# Patient Record
Sex: Male | Born: 1995 | Race: White | Hispanic: No | Marital: Married | Smoking: Never smoker
Health system: Southern US, Community
[De-identification: ages and names within clinical notes are randomized; demographics above are authoritative.]

## PROBLEM LIST (undated history)

## (undated) DIAGNOSIS — B019 Varicella without complication: Secondary | ICD-10-CM

## (undated) HISTORY — DX: Varicella without complication: B01.9

---
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
## 2015-01-29 ENCOUNTER — Ambulatory Visit (INDEPENDENT_AMBULATORY_CARE_PROVIDER_SITE_OTHER): Payer: Managed Care, Other (non HMO) | Admitting: Family Medicine

## 2015-01-29 ENCOUNTER — Encounter: Payer: Self-pay | Admitting: Family Medicine

## 2015-01-29 VITALS — BP 117/78 | HR 55 | Temp 98.5°F | Ht 68.0 in | Wt 142.0 lb

## 2015-01-29 DIAGNOSIS — Z0000 Encounter for general adult medical examination without abnormal findings: Secondary | ICD-10-CM

## 2015-01-29 NOTE — Progress Notes (Signed)
   Subjective:    Patient ID: Jonathan Sanders, male    DOB: 1995/08/22, 19 y.o.   MRN: [id number]  HPI 19 yr old male for a cpx. He feels well in general but he does ask me to check his right upper mouth. He had a tooth pulled yesterday and last night the right side of his face swelled up. He applied ice and today most of the swelling has resolved. He has mild pain in the area but not much.    Review of Systems  Constitutional: Negative.   HENT: Positive for facial swelling. Negative for congestion, dental problem, drooling, ear discharge, ear pain, hearing loss, mouth sores, nosebleeds, postnasal drip, rhinorrhea, sinus pressure, sneezing, sore throat, tinnitus, trouble swallowing and voice change.   Eyes: Negative.   Respiratory: Negative.   Cardiovascular: Negative.   Gastrointestinal: Negative.   Genitourinary: Negative.   Musculoskeletal: Negative.   Skin: Negative.   Neurological: Negative.   Psychiatric/Behavioral: Negative.        Objective:   Physical Exam  Constitutional: He is oriented to person, place, and time. He appears well-developed and well-nourished. No distress.  HENT:  Head: Normocephalic and atraumatic.  Right Ear: External ear normal.  Left Ear: External ear normal.  Nose: Nose normal.  Mouth/Throat: Oropharynx is clear and moist. No oropharyngeal exudate.  Mildly swollen over the right cheek but the area is not tender  Eyes: Conjunctivae and EOM are normal. Pupils are equal, round, and reactive to light. Right eye exhibits no discharge. Left eye exhibits no discharge. No scleral icterus.  Neck: Neck supple. No JVD present. No tracheal deviation present. No thyromegaly present.  Cardiovascular: Normal rate, regular rhythm, normal heart sounds and intact distal pulses.  Exam reveals no gallop and no friction rub.   No murmur heard. Pulmonary/Chest: Effort normal and breath sounds normal. No respiratory distress. He has no wheezes. He has no rales. He exhibits  no tenderness.  Abdominal: Soft. Bowel sounds are normal. He exhibits no distension and no mass. There is no tenderness. There is no rebound and no guarding.  Genitourinary: Rectum normal, prostate normal and penis normal. Guaiac negative stool. No penile tenderness.  Musculoskeletal: Normal range of motion. He exhibits no edema or tenderness.  Lymphadenopathy:    He has no cervical adenopathy.  Neurological: He is alert and oriented to person, place, and time. He has normal reflexes. No cranial nerve deficit. He exhibits normal muscle tone. Coordination normal.  Skin: Skin is warm and dry. No rash noted. He is not diaphoretic. No erythema. No pallor.  Psychiatric: He has a normal mood and affect. His behavior is normal. Judgment and thought content normal.          Assessment & Plan:  Well exam. We discussed diet and exercise advice. He will set up fasting labs soon. The site of his dental extraction looks clean.

## 2015-01-29 NOTE — Progress Notes (Signed)
Pre visit review using our clinic review tool, if applicable. No additional management support is needed unless otherwise documented below in the visit note. 

## 2015-02-01 ENCOUNTER — Other Ambulatory Visit (INDEPENDENT_AMBULATORY_CARE_PROVIDER_SITE_OTHER): Payer: Managed Care, Other (non HMO)

## 2015-02-01 DIAGNOSIS — Z0000 Encounter for general adult medical examination without abnormal findings: Secondary | ICD-10-CM

## 2015-02-01 LAB — CBC WITH DIFFERENTIAL/PLATELET
BASOS PCT: 0.4 % (ref 0.0–3.0)
Basophils Absolute: 0 10*3/uL (ref 0.0–0.1)
EOS ABS: 0.6 10*3/uL (ref 0.0–0.7)
Eosinophils Relative: 6.5 % — ABNORMAL HIGH (ref 0.0–5.0)
HEMATOCRIT: 45.7 % (ref 36.0–49.0)
HEMOGLOBIN: 15.7 g/dL (ref 12.0–16.0)
LYMPHS PCT: 24 % (ref 24.0–48.0)
Lymphs Abs: 2.4 10*3/uL (ref 0.7–4.0)
MCHC: 34.3 g/dL (ref 31.0–37.0)
MCV: 86.6 fl (ref 78.0–98.0)
Monocytes Absolute: 0.9 10*3/uL (ref 0.1–1.0)
Monocytes Relative: 9 % (ref 3.0–12.0)
Neutro Abs: 6 10*3/uL (ref 1.4–7.7)
Neutrophils Relative %: 60.1 % (ref 43.0–71.0)
Platelets: 180 10*3/uL (ref 150.0–575.0)
RBC: 5.28 Mil/uL (ref 3.80–5.70)
RDW: 13.2 % (ref 11.4–15.5)
WBC: 9.9 10*3/uL (ref 4.5–13.5)

## 2015-02-01 LAB — HEPATIC FUNCTION PANEL
ALK PHOS: 98 U/L (ref 52–171)
ALT: 12 U/L (ref 0–53)
AST: 19 U/L (ref 0–37)
Albumin: 4.9 g/dL (ref 3.5–5.2)
Bilirubin, Direct: 0.1 mg/dL (ref 0.0–0.3)
Total Bilirubin: 0.6 mg/dL (ref 0.2–1.2)
Total Protein: 7.6 g/dL (ref 6.0–8.3)

## 2015-02-01 LAB — POCT URINALYSIS DIPSTICK
BILIRUBIN UA: NEGATIVE
Glucose, UA: NEGATIVE
Ketones, UA: NEGATIVE
Leukocytes, UA: NEGATIVE
Nitrite, UA: NEGATIVE
Protein, UA: NEGATIVE
RBC UA: NEGATIVE
Spec Grav, UA: 1.02
UROBILINOGEN UA: 0.2
pH, UA: 7

## 2015-02-01 LAB — LIPID PANEL
Cholesterol: 151 mg/dL (ref 0–200)
HDL: 58 mg/dL (ref >39.00)
LDL Cholesterol: 82 mg/dL (ref 0–99)
NONHDL: 93.23
Total CHOL/HDL Ratio: 3
Triglycerides: 57 mg/dL (ref 0.0–149.0)
VLDL: 11.4 mg/dL (ref 0.0–40.0)

## 2015-02-01 LAB — BASIC METABOLIC PANEL
BUN: 9 mg/dL (ref 6–23)
CO2: 33 mEq/L — ABNORMAL HIGH (ref 19–32)
Calcium: 10.3 mg/dL (ref 8.4–10.5)
Chloride: 101 mEq/L (ref 96–112)
Creatinine, Ser: 0.81 mg/dL (ref 0.40–1.50)
GFR: 130 mL/min (ref >60.00)
Glucose, Bld: 83 mg/dL (ref 70–99)
Potassium: 4.6 mEq/L (ref 3.5–5.1)
Sodium: 141 mEq/L (ref 135–145)

## 2015-02-01 LAB — TSH: TSH: 3.23 u[IU]/mL (ref 0.40–5.00)

## 2015-02-16 ENCOUNTER — Telehealth: Payer: Self-pay | Admitting: Family Medicine

## 2015-02-16 NOTE — Telephone Encounter (Signed)
Pt wife is calling concerning a form she dropped off a month ago and pt had cpx 01-29-15. Pt wife would like to know the status of form

## 2015-02-17 NOTE — Telephone Encounter (Signed)
Paperwork is ready for pick up and I left a voice message for pt.

## 2015-06-16 ENCOUNTER — Ambulatory Visit (INDEPENDENT_AMBULATORY_CARE_PROVIDER_SITE_OTHER): Payer: Managed Care, Other (non HMO) | Admitting: Family Medicine

## 2015-06-16 ENCOUNTER — Encounter: Payer: Self-pay | Admitting: Family Medicine

## 2015-06-16 VITALS — BP 114/73 | HR 70 | Temp 98.6°F | Ht 68.0 in | Wt 143.0 lb

## 2015-06-16 DIAGNOSIS — Z0289 Encounter for other administrative examinations: Secondary | ICD-10-CM | POA: Insufficient documentation

## 2015-06-16 DIAGNOSIS — Z023 Encounter for examination for recruitment to armed forces: Secondary | ICD-10-CM

## 2015-06-16 NOTE — Progress Notes (Signed)
Pre visit review using our clinic review tool, if applicable. No additional management support is needed unless otherwise documented below in the visit note. 

## 2015-06-16 NOTE — Progress Notes (Signed)
   Subjective:    Patient ID: Jonathan Sanders, male    DOB: 10-20-1995, 20 y.o.   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 He feels fine.   Review of Systems  Constitutional: Negative.   Respiratory: Negative.   Cardiovascular: Negative.   Neurological: Negative.        Objective:   Physical Exam  Constitutional: He is oriented to person, place, and time. He appears well-developed and well-nourished.  Neck: No thyromegaly present.  Cardiovascular: Normal rate, regular rhythm, normal heart sounds and intact distal pulses.   Pulmonary/Chest: Effort normal and breath sounds normal.  Lymphadenopathy:    He has no cervical adenopathy.  Neurological: He is alert and oriented to person, place, and time.          Assessment & Plan:  He is healthy. He is cleared for the POPAT exam.

## 2015-09-07 ENCOUNTER — Ambulatory Visit (INDEPENDENT_AMBULATORY_CARE_PROVIDER_SITE_OTHER): Payer: Managed Care, Other (non HMO) | Admitting: Family Medicine

## 2015-09-07 ENCOUNTER — Encounter: Payer: Self-pay | Admitting: Family Medicine

## 2015-09-07 VITALS — BP 126/81 | HR 54 | Temp 98.4°F | Ht 68.0 in | Wt 144.0 lb

## 2015-09-07 DIAGNOSIS — K648 Other hemorrhoids: Secondary | ICD-10-CM | POA: Insufficient documentation

## 2015-09-07 DIAGNOSIS — K649 Unspecified hemorrhoids: Secondary | ICD-10-CM | POA: Insufficient documentation

## 2015-09-07 MED ORDER — HYDROCORTISONE ACE-PRAMOXINE 2.5-1 % RE CREA
1.0000 | TOPICAL_CREAM | Freq: Three times a day (TID) | RECTAL | Status: DC
Start: 2015-09-07 — End: 2016-07-10

## 2015-09-07 NOTE — Progress Notes (Signed)
Pre visit review using our clinic review tool, if applicable. No additional management support is needed unless otherwise documented below in the visit note. 

## 2015-09-07 NOTE — Progress Notes (Signed)
Subjective:    Patient ID: NIKOLOS CORKILL, male    DOB: 1995-08-10, 20 y.o.   MRN: [id number]  HPI Here for hemorrhoids that itch and burn at times. No bleeding. He has tried Preparation H and witch hazel OTC with mixed results.    Review of Systems  Constitutional: Negative.   Gastrointestinal: Positive for rectal pain. Negative for blood in stool and anal bleeding.       Objective:   Physical Exam  Constitutional: He appears well-developed and well-nourished.  Abdominal: Soft. Bowel sounds are normal. He exhibits no distension and no mass. There is no tenderness. There is no rebound and no guarding.  Genitourinary:  Several small external hemorrhoids are seen           Assessment & Plan:  Hemorrhoids, try Pramoxine HC cream prn.  Nelwyn Salisbury, MD

## 2016-07-10 ENCOUNTER — Encounter: Payer: Self-pay | Admitting: Family Medicine

## 2016-07-10 ENCOUNTER — Ambulatory Visit (INDEPENDENT_AMBULATORY_CARE_PROVIDER_SITE_OTHER): Payer: BLUE CROSS/BLUE SHIELD | Admitting: Family Medicine

## 2016-07-10 VITALS — BP 108/80 | Ht 68.0 in | Wt 145.0 lb

## 2016-07-10 DIAGNOSIS — G4710 Hypersomnia, unspecified: Secondary | ICD-10-CM | POA: Diagnosis not present

## 2016-07-10 NOTE — Patient Instructions (Signed)
WE NOW OFFER   [facility] FAST TRACK!!!  SAME DAY Appointments for ACUTE CARE  Such as: Sprains, Injuries, cuts, abrasions, rashes, muscle pain, joint pain, back pain Colds, flu, sore throats, headache, allergies, cough, fever  Ear pain, sinus and eye infections Abdominal pain, nausea, vomiting, diarrhea, upset stomach Animal/insect bites  3 Easy Ways to Schedule: Walk-In Scheduling Call in scheduling Mychart Sign-up: [email or web address]         

## 2016-07-10 NOTE — Progress Notes (Signed)
   Subjective:    Patient ID: Jonathan Sanders, male    DOB: 04/21/1996, 21 y.o.   MRN: [id number]  HPI Here for a worsening pattern of sleeping too deeply. He says he has been a deep sleeper all his life and that he always has a hard time waking up. However over the past few months this has gotten to the point that he sleeps through multiple alarms and it is affecting his job. He says he sleeps well and averages 8-10 hours a day, but he often feels tired an sleepy during the day. He snores off and on, but his wife has never noted him to stop breathing. His weight is stable.    Review of Systems  Constitutional: Positive for fatigue. Negative for activity change, appetite change and unexpected weight change.  Respiratory: Negative.   Cardiovascular: Negative.   Neurological: Negative.   Psychiatric/Behavioral: Negative.        Objective:   Physical Exam  Constitutional: He is oriented to person, place, and time. He appears well-developed and well-nourished.  Neck: No thyromegaly present.  Cardiovascular: Normal rate, regular rhythm, normal heart sounds and intact distal pulses.   Pulmonary/Chest: Effort normal and breath sounds normal.  Lymphadenopathy:    He has no cervical adenopathy.  Neurological: He is alert and oriented to person, place, and time.  Psychiatric: He has a normal mood and affect. His behavior is normal. Thought content normal.          Assessment & Plan:  Excessive sleepiness. We will refer him to Sleep Medicine.  Gershon CraneStephen Fry, MD

## 2016-07-10 NOTE — Progress Notes (Signed)
Pre visit review using our clinic review tool, if applicable. No additional management support is needed unless otherwise documented below in the visit note. 

## 2016-07-12 ENCOUNTER — Ambulatory Visit (INDEPENDENT_AMBULATORY_CARE_PROVIDER_SITE_OTHER): Payer: BLUE CROSS/BLUE SHIELD | Admitting: Pulmonary Disease

## 2016-07-12 ENCOUNTER — Encounter: Payer: Self-pay | Admitting: Pulmonary Disease

## 2016-07-12 DIAGNOSIS — G4710 Hypersomnia, unspecified: Secondary | ICD-10-CM | POA: Diagnosis not present

## 2016-07-12 DIAGNOSIS — G4711 Idiopathic hypersomnia with long sleep time: Secondary | ICD-10-CM | POA: Insufficient documentation

## 2016-07-12 NOTE — Assessment & Plan Note (Signed)
Differential diagnosis here is idiopathic hypersomnolence versus narcolepsy. Does not seem to have any red flags to indicate obstructive sleep apnea or restless leg syndrome.  We discussed rules of sleep hygiene, regular sleep and wake up at times, exercise program and plan for at least 1 or 2 naps in the daytime, caution while driving  We will schedule overnight polysomnogram followed by [facility] I have asked him to keep a sleep diary for at least 2 weeks

## 2016-07-12 NOTE — Patient Instructions (Signed)
schedule overnight polysomnogram followed by [facility] I have asked him to keep a sleep diary for at least 2 weeks

## 2016-07-12 NOTE — Progress Notes (Signed)
Subjective:    Patient ID: Jonathan Sanders, male    DOB: 01/15/1996, 21 y.o.   MRN: [id number]  HPI  Chief Complaint  Patient presents with  . Sleep Consult    Referred by Dr. Clent Ridges. Easy for him to fall asleep but hard to wake up. Has been going on for years.     Jonathan Sanders is a 21 year old man presents for evaluation of excessive daytime somnolence. Epworth sleepiness score is 13 and a report sleepiness while sitting and reading, as a passenger in a car or, lying down to rest in the afternoon. He has fallen asleep while having conversations with his wife. 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 Even as a teenager he would always sleep very hard and was hard to wake up. He was home schooled and it would always take them 3 or 4 alarms to wake up. He has been married for 3 years and his wife always has to call him when he is away from home to ensure that he has woken up in the morning. Bedtime is between 9 and 10 PM, sleep latency is minimal, he sleeps on his right side with one pillow, denies nocturnal awakenings and is out of bed anywhere between 5 AM to 8 AM depending on his work schedule.  The longer he sleeps to worse he feels, takes about an hour or 2 to get going. He reports one to 2 episodes of nocturnal enuresis over the last 2 months. His older brother had a similar problem with bedwetting as a teenager. His wife has not witnessed apneas and has not reported loud snoring. He drinks 3-6 cans of Pepsi daily and 1 or 2 monster energy drinks when he is on the road. There is no history suggestive of cataplexy or parasomnias  He does report history suggestive of sleep paralysis especially on waking up from sleep His grandfather may have had narcolepsy but is not clear at what age he was diagnosed. He denies childhood history of  trauma except for one episode of concussion as a teenager     Past Medical History:  Diagnosis Date  . Chicken pox      No past surgical history on file.   No Known Allergies   Social History   Social History  . Marital status: Single    Spouse name: N/A  . Number of children: N/A  . Years of education: N/A   Occupational History  . Not on file.   Social History Main Topics  . Smoking status: Never Smoker  . Smokeless tobacco: Never Used  . Alcohol use 0.0 oz/week     Comment: occ  . Drug use: No  . Sexual activity: Not on file   Other Topics Concern  . Not on file   Social History Narrative  . No narrative on file   Breast Cancer-relatedfamily history is not on file.   Review of Systems Constitutional: negative for anorexia, fevers and sweats  Eyes: negative for irritation, redness and visual disturbance  Ears, nose, mouth, throat, and face: negative for earaches, epistaxis, nasal congestion and sore throat  Respiratory: negative for cough, dyspnea on exertion, sputum and wheezing  Cardiovascular: negative for chest pain, dyspnea, lower extremity edema, orthopnea, palpitations and syncope  Gastrointestinal: negative for abdominal pain, constipation, diarrhea, melena, nausea and  vomiting  Genitourinary:negative for dysuria, frequency and hematuria  Hematologic/lymphatic: negative for bleeding, easy bruising and lymphadenopathy  Musculoskeletal:negative for arthralgias, muscle weakness and stiff joints  Neurological: negative for coordination problems, gait problems, headaches and weakness  Endocrine: negative for diabetic symptoms including polydipsia, polyuria and weight loss     Objective:   Physical Exam   Gen. Pleasant, well-nourished, in no distress, normal affect ENT - no lesions, no post nasal drip Neck: No JVD, no thyromegaly, no carotid bruits Lungs: no use of accessory muscles, no dullness to percussion, clear without rales or rhonchi    Cardiovascular: Rhythm regular, heart sounds  normal, no murmurs or gallops, no peripheral edema Abdomen: soft and non-tender, no hepatosplenomegaly, BS normal. Musculoskeletal: No deformities, no cyanosis or clubbing Neuro:  alert, non focal        Assessment & Plan:

## 2016-08-29 ENCOUNTER — Ambulatory Visit (HOSPITAL_BASED_OUTPATIENT_CLINIC_OR_DEPARTMENT_OTHER): Payer: BLUE CROSS/BLUE SHIELD | Attending: Pulmonary Disease | Admitting: Pulmonary Disease

## 2016-08-29 DIAGNOSIS — G4710 Hypersomnia, unspecified: Secondary | ICD-10-CM | POA: Diagnosis not present

## 2016-08-30 ENCOUNTER — Ambulatory Visit (HOSPITAL_BASED_OUTPATIENT_CLINIC_OR_DEPARTMENT_OTHER): Payer: BLUE CROSS/BLUE SHIELD | Attending: Pulmonary Disease | Admitting: Pulmonary Disease

## 2016-08-30 DIAGNOSIS — G4719 Other hypersomnia: Secondary | ICD-10-CM | POA: Diagnosis not present

## 2016-08-30 DIAGNOSIS — G4711 Idiopathic hypersomnia with long sleep time: Secondary | ICD-10-CM | POA: Insufficient documentation

## 2016-08-30 DIAGNOSIS — G4710 Hypersomnia, unspecified: Secondary | ICD-10-CM

## 2016-08-31 DIAGNOSIS — G4710 Hypersomnia, unspecified: Secondary | ICD-10-CM | POA: Diagnosis not present

## 2016-08-31 NOTE — Procedures (Signed)
Patient Name: Jonathan Sanders, [name] Study Date: 08/29/2016 Gender: Male D.O.B: Jun 15, 1995 Age (years): 20 Referring Provider: Cyril Mourningakesh Raynold Blankenbaker MD, ABSM Height (inches): 68 Interpreting Physician: Cyril Mourningakesh Lachanda Buczek MD, ABSM Weight (lbs): 140 RPSGT: Ulyess MortSpruill, Vicki BMI: 21 MRN: [id number] Neck Size: 14.00   CLINICAL INFORMATION Sleep Study Type: NPSG  Indication for sleep study: Daytime Fatigue, Fatigue  Epworth Sleepiness Score: 12  SLEEP STUDY TECHNIQUE As per the AASM Manual for the Scoring of Sleep and Associated Events v2.3 (April 2016) with a hypopnea requiring 4% desaturations.  The channels recorded and monitored were frontal, central and occipital EEG, electrooculogram (EOG), submentalis EMG (chin), nasal and oral airflow, thoracic and abdominal wall motion, anterior tibialis EMG, snore microphone, electrocardiogram, and pulse oximetry.  MEDICATIONS Medications self-administered by patient taken the night of the study : N/A  SLEEP ARCHITECTURE The study was initiated at 10:38:49 PM and ended at 6:04:25 AM.  Sleep onset time was 7.4 minutes and the sleep efficiency was 93.9%. The total sleep time was 418.5 minutes.  Stage REM latency was 186.0 minutes.  The patient spent 14.81% of the night in stage N1 sleep, 53.88% in stage N2 sleep, 8.72% in stage N3 and 22.58% in REM.  Alpha intrusion was absent.  Supine sleep was 3.93%.  RESPIRATORY PARAMETERS The overall apnea/hypopnea index (AHI) was 0.4 per hour. There were 2 total apneas, including 1 obstructive, 1 central and 0 mixed apneas. There were 1 hypopneas and 30 RERAs.  The AHI during Stage REM sleep was 0.6 per hour.  AHI while supine was 0.0 per hour.  The mean oxygen saturation was 96.46%. The minimum SpO2 during sleep was 93.00%.  Soft snoring was noted during this study.  CARDIAC DATA The 2 lead EKG demonstrated sinus rhythm. The mean heart rate was 61.29 beats per minute. Other EKG findings include: None.   LEG  MOVEMENT DATA The total PLMS were 36 with a resulting PLMS index of 5.16. Associated arousal with leg movement index was 0.6 .  IMPRESSIONS - No significant obstructive sleep apnea occurred during this study (AHI = 0.4/h). - No significant central sleep apnea occurred during this study (CAI = 0.1/h). - The patient had minimal or no oxygen desaturation during the study (Min O2 = 93.00%) - The patient snored with Soft snoring volume. - No cardiac abnormalities were noted during this study. - Mild periodic limb movements of sleep occurred during the study. No significant associated arousals. - No evidence of increased muscle tone during REM.   DIAGNOSIS - Hypersomnolence   RECOMMENDATIONS - Consider sleep diary & MSLT to investigate for other causes of  hypersomnolence Avoid alcohol, sedatives and other CNS depressants that may worsen sleep apnea and disrupt normal sleep architecture. - Sleep hygiene should be reviewed to assess factors that may improve sleep quality. - Weight management and regular exercise should be initiated or continued if appropriate.    Cyril Mourningakesh Gao Mitnick MD Board Certified in Sleep medicine

## 2016-09-07 ENCOUNTER — Telehealth: Payer: Self-pay

## 2016-09-07 DIAGNOSIS — G4710 Hypersomnia, unspecified: Secondary | ICD-10-CM | POA: Diagnosis not present

## 2016-09-07 NOTE — Telephone Encounter (Signed)
Offered patient a double book spot for June 19th (since that was the only day that you were not already double booked). Patient declined, stating that he would prefer to speak with you over the phone instead of waiting that long for an appt.   Are you willing to speak to him over the phone? And if not, is it ok to schedule him with TP, who has openings? Thanks!

## 2016-09-07 NOTE — Telephone Encounter (Signed)
Called and spoke with patient. 5/29 will work for him. Explained to him that the schedyle was not open now, but I will put a reminder in to get him scheduled on that day. Patient verbalized understanding.

## 2016-09-07 NOTE — Procedures (Signed)
Patient Name: Jonathan Sanders, Jonathan Sanders Study Date: 08/30/2016 Gender: Male D.O.B: 09/05/1995 Age (years): 20 Referring Provider: Cyril Mourningakesh Janayla Marik MD, ABSM Height (inches): 68 Interpreting Physician: Cyril Mourningakesh Latrail Pounders MD, ABSM Weight (lbs): 140 RPSGT: May SinkBarksdale, Vernon BMI: 21 MRN: [id number] Neck Size: 14.00   CLINICAL INFORMATION Sleep Study Type: MSLT  The patient was referred to the sleep center for evaluation of daytime sleepiness. NPSG was negative for sleep disordered breathing  Epworth Sleepiness Score: 12  SLEEP STUDY TECHNIQUE A Multiple Sleep Latency Test was performed after an overnight polysomnogram according to the AASM scoring manual v2.3 (April 2016) and clinical guidelines. Five nap opportunities occurred over the course of the test which followed an overnight polysomnogram. The channels recorded and monitored were frontal, central, and occipital electroencephalography (EEG), right and left electrooculogram (EOG), chin electromyography (EMG), and electrocardiogram (EKG).  MEDICATIONS Medications taken by the patient : none Medications administered by patient during sleep study : No sleep medicine administered.   IMPRESSIONS - Total number of naps attempted: 5.00 . Total number of naps with sleep attained: . The Mean Sleep Latency was 02:18 minutes. There were sleep-onset REM periods. - The patient appears to have pathologic sleepiness, evidenced by a short mean sleep latency (8 minutes or less) on this MSLT. - No sleep onset REMs were noted during this MSLT.   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 - Return for follow up to evaluate other causes of excessive daytime sleepiness. - Assess sleep diary to ensure adequate sleep   Cyril Mourningakesh Anais Denslow MD Board Certified in Sleep medicine

## 2016-09-07 NOTE — Telephone Encounter (Signed)
-----   Message from Oretha Milchakesh Alva V, MD sent at 09/07/2016 10:48 AM EDT ----- Needs OV with me to discuss PSG/MSLT He should keep sleep diary x 2 weeks  RA

## 2016-09-07 NOTE — Telephone Encounter (Signed)
More complicated discussion -would prefer office visit  may open office slot on 5/29 -please keep him on list

## 2016-09-13 NOTE — Telephone Encounter (Signed)
Pt returned call and [facility] @ 400.Caren GriffinsStanley A Dalton

## 2016-09-13 NOTE — Telephone Encounter (Signed)
Left message for patient to call back to schedule appt with RA next week. I have 2 blocked appt times for him.

## 2016-09-21 ENCOUNTER — Encounter: Payer: Self-pay | Admitting: Pulmonary Disease

## 2016-09-21 ENCOUNTER — Ambulatory Visit (INDEPENDENT_AMBULATORY_CARE_PROVIDER_SITE_OTHER): Payer: BLUE CROSS/BLUE SHIELD | Admitting: Pulmonary Disease

## 2016-09-21 DIAGNOSIS — G4711 Idiopathic hypersomnia with long sleep time: Secondary | ICD-10-CM

## 2016-09-21 MED ORDER — ARMODAFINIL 150 MG PO TABS
ORAL_TABLET | ORAL | 1 refills | Status: DC
Start: 2016-09-21 — End: 2016-10-13

## 2016-09-21 NOTE — Assessment & Plan Note (Signed)
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

## 2016-09-21 NOTE — Patient Instructions (Signed)
You do not have narcolepsy- you have idiopathic hypersomnolence Schedule CT brain without contrast  We discussed -Sleep quantity-at least 9 hours of sleep every day -Keep a fixed bedtime and wake up times whenever possible -Exercise regimen daily -Trial of Nuvigil 150 mg at 8 AM- call back in 2 weeks to report  Keep sleep diary for 2 weeks

## 2016-09-21 NOTE — Progress Notes (Signed)
   Subjective:    Patient ID: Jonathan Sanders, male    DOB: 06/05/1995, 21 y.o.   MRN: [id number]  HPI  21 year old man for FU of excessive daytime somnolence. Epworth sleepiness score 13   He is accompanied by his wife Jonathan Sanders today who corroborates most of his history  He reports 1- 2 episodes of nocturnal enuresis over the last 2 months. His older brother had a similar problem with bedwetting as a teenager.   He also reports memory issues He does report history suggestive of sleep paralysis especially on waking up from sleep His grandfather  had narcolepsy Diagnosed in his 30s He denies childhood history of trauma except for one episode of concussion as a teenager   Significant tests/ events reviewed  NPSG 08/29/16 >> neg MSLT 08/30/16 >> latency 2 mins, no SOREMs  Review of Systems Patient denies significant dyspnea,cough, hemoptysis,  chest pain, palpitations, pedal edema, orthopnea, paroxysmal nocturnal dyspnea, lightheadedness, nausea, vomiting, abdominal or  leg pains      Objective:   Physical Exam   Gen. Pleasant, well-nourished,lean, in no distress ENT - no thrush, no post nasal drip Neck: No JVD, no thyromegaly, no carotid bruits Lungs: no use of accessory muscles, no dullness to percussion, clear without rales or rhonchi  Cardiovascular: Rhythm regular, heart sounds  normal, no murmurs or gallops, no peripheral edema Musculoskeletal: No deformities, no cyanosis or clubbing         Assessment & Plan:

## 2016-09-29 ENCOUNTER — Telehealth: Payer: Self-pay | Admitting: Pulmonary Disease

## 2016-09-29 NOTE — Telephone Encounter (Signed)
Received PA request from patient's pharmacy. PA was started on cmm. Key is [id number]. Will wait on plan's response.

## 2016-10-03 NOTE — Telephone Encounter (Signed)
Insurance will not cover Armodafinil 150mg .   RA, do you want me to appeal this decision? Please advise. Thanks

## 2016-10-03 NOTE — Telephone Encounter (Signed)
Checked CMM, there is no decision as of yet.  Will await decision.

## 2016-10-04 NOTE — Telephone Encounter (Signed)
Will they cover provigil or modafinil ?

## 2016-10-04 NOTE — Telephone Encounter (Signed)
Spoke with a rep from the PA dept at [facility] [employer]. Armodafinil., Provigil, and modafinil are not covered with the diagnosis of idiopathic hypersomnia.   Pt needs to have a diagnosis of OSA or narcolepsy in order for them to cover the medication.

## 2016-10-04 NOTE — Telephone Encounter (Signed)
Will check formulary.

## 2016-10-05 NOTE — Telephone Encounter (Signed)
Please let patient know Alternative would be Adderall type medication, if he is willing

## 2016-10-05 NOTE — Telephone Encounter (Signed)
Spoke with patient regarding the medication denial and the next medication. Patient stated he would like to think about the Adderall. He will call us back.   Also gave patient number to [facility] to reschedule his CT scan.   Nothing else was needed at time of call.

## 2016-10-09 ENCOUNTER — Ambulatory Visit (HOSPITAL_COMMUNITY): Payer: BLUE CROSS/BLUE SHIELD

## 2016-10-11 ENCOUNTER — Telehealth: Payer: Self-pay | Admitting: Pulmonary Disease

## 2016-10-11 NOTE — Telephone Encounter (Signed)
Adderall 10 mg once daily in am #15 (generic OK) Call in 1 week to report

## 2016-10-11 NOTE — Telephone Encounter (Signed)
RA  Please Advise-  Pt called back and stated that he has decided he does want to try the Adderall that you suggested. Please advise on what exactly what rx you wanted to send in it.    Oretha MilchAlva, Rakesh V, MD  to Maurene CapesPotts, Cherina M, CMA     12:44 PM  Note    Please let patient know Alternative would be Adderall type medication, if he is willing

## 2016-10-12 NOTE — Telephone Encounter (Signed)
Will route message to Cherina. RA will be in the office tomorrow. Rx can be printed and signed then. Pt is aware of this information and will await our phone call tomorrow letting him know that his prescription is ready.

## 2016-10-13 MED ORDER — AMPHETAMINE-DEXTROAMPHETAMINE 10 MG PO TABS: 10.0000 mg | ORAL_TABLET | Freq: Every day | ORAL | 0 refills | Status: DC

## 2016-10-13 NOTE — Telephone Encounter (Signed)
Rx has been printed and placed in RA's look at. [facility] for follow up.

## 2016-10-13 NOTE — Telephone Encounter (Signed)
RX has been signed. Message left for patient. Unsure if he wants to pick up RX or have it mailed to him.

## 2016-10-16 NOTE — Telephone Encounter (Signed)
RX has been mailed to PT.

## 2016-10-16 NOTE — Telephone Encounter (Signed)
cherina I called the pt and he stated that he wants the rx mailed to him please.  Thanks

## 2016-10-31 ENCOUNTER — Telehealth: Payer: Self-pay | Admitting: Pulmonary Disease

## 2016-10-31 NOTE — Telephone Encounter (Signed)
RA  Please Advise-  Pt called in and stated that the adderrall works for him but he notices it wears off around 3 in the afternoon and then he gets a headache and feels drowsy . He is unsure as to what he should do further

## 2016-11-01 NOTE — Telephone Encounter (Signed)
Lets stay at this dose for now  headaches should get better with time - In future we can consider adding a second dose if needed at 3 pm

## 2016-11-02 ENCOUNTER — Telehealth: Payer: Self-pay | Admitting: Pulmonary Disease

## 2016-11-02 NOTE — Telephone Encounter (Signed)
Okay to refill? 

## 2016-11-02 NOTE — Telephone Encounter (Signed)
Pt calling to give update on Adderall - pt states that he is tolerating well and but it started wearing off around 3pm and HAs returned. Pt states that he has spoken with a nurse 11/01/16 and was advised to stay at current dose for now.  Jonathan Sanders, Jonathan V, MD  9:50 AM   Note Lets stay at this dose for now  headaches should get better with time - In future we can consider adding a second dose if needed at 3 pm     Please advise of refill Dr Vassie LollAlva. Thanks.

## 2016-11-02 NOTE — Telephone Encounter (Signed)
Spoke with pt. He is aware of RA's response. Nothing further was needed. 

## 2016-11-02 NOTE — Telephone Encounter (Signed)
RA  Please Advise-  The last time you prescribed this a  Quantity:  15 tablet. Did you want to prescribe more or stay at same amount.

## 2016-11-03 MED ORDER — AMPHETAMINE-DEXTROAMPHETAMINE 10 MG PO TABS: ORAL_TABLET | ORAL | 0 refills | Status: DC

## 2016-11-03 NOTE — Telephone Encounter (Signed)
Spoke with patient. He is aware of RA's recs. RX has been printed and signed. Pt wishes to have this medication mailed to him. Verified address on the phone. Will be placed in the mail today. Pt verbalized understanding and nothing else was needed at time of call.

## 2016-11-03 NOTE — Telephone Encounter (Signed)
60 tabs

## 2016-11-15 ENCOUNTER — Telehealth: Payer: Self-pay | Admitting: Pulmonary Disease

## 2016-11-15 NOTE — Telephone Encounter (Signed)
Attempted to contact pt. No answer, no option to leave a message. Will try back.  

## 2016-11-16 NOTE — Telephone Encounter (Signed)
Called spoke with patient who reported he never received his Adderall 10mg  BID #60 Rx from the 7.10.18 phone note Pt stated that he did just move to a new address this past Monday 7.23.18 but the previous address was verified with Cherina on the phone  Below is patient's new address and was verified with patient on the phone [address] [place] [id number]  Patient is aware that RA will need to authorize another Rx for this and is okay with this process Dr Vassie LollAlva please advise if we may do another Adderall Rx for patient, thank you

## 2016-11-20 MED ORDER — AMPHETAMINE-DEXTROAMPHETAMINE 10 MG PO TABS: ORAL_TABLET | ORAL | 0 refills | Status: DC

## 2016-11-20 NOTE — Telephone Encounter (Signed)
Pt aware that another Rx is being printed and signed and will be placed up front for pick up.  Pt states that he will come pick this up today or tomorrow.   Rx placed up front per Cherina.  Nothing further needed.

## 2016-11-20 NOTE — Telephone Encounter (Signed)
OK for another Rx Please check with pharmacy to make sure not filled

## 2016-12-27 ENCOUNTER — Other Ambulatory Visit: Payer: Self-pay | Admitting: Pulmonary Disease

## 2016-12-27 NOTE — Telephone Encounter (Signed)
Pt requesting adderall 10mg  refill- #60 with 0 refills, 1 po bid. Pt requests this rx to be mailed to him.  Last refill: 11/20/16 Last ov: 09/21/16 Next ov: none scheduled  RA please advise on refill.  Thanks!

## 2016-12-27 NOTE — Telephone Encounter (Signed)
OK to refill

## 2016-12-28 MED ORDER — AMPHETAMINE-DEXTROAMPHETAMINE 10 MG PO TABS: ORAL_TABLET | ORAL | 0 refills | Status: DC

## 2016-12-28 NOTE — Telephone Encounter (Signed)
RX has been printed. Will place in the mail as soon as RA signs the RX this afternoon since I am in [place] this morning. Will call the patient to let him know that it has been placed in the mail.

## 2016-12-28 NOTE — Telephone Encounter (Signed)
RX has been signed. Pt is aware that RX has been placed in the mail today.

## 2017-02-09 ENCOUNTER — Telehealth: Payer: Self-pay | Admitting: Pulmonary Disease

## 2017-02-09 ENCOUNTER — Other Ambulatory Visit: Payer: Self-pay | Admitting: Pulmonary Disease

## 2017-02-09 MED ORDER — AMPHETAMINE-DEXTROAMPHETAMINE 10 MG PO TABS: ORAL_TABLET | ORAL | 0 refills | Status: DC

## 2017-02-09 NOTE — Telephone Encounter (Signed)
Patient showed up in the lobby for Adderall RX. Since RA is not here, I had CY sign.   Patient received his RX. Nothing else needed.

## 2017-02-09 NOTE — Telephone Encounter (Signed)
Called and spoke with pt and he is aware that RA is working in the hospital and not back in the office until Monday.  Pt is needing to pick up rx for the adderall 10 mg  Today.  CY are you ok to sign this rx in RA absence?  Thanks  Was last filled on 12/28/16 with 60 tablets.

## 2017-02-09 NOTE — Telephone Encounter (Signed)
Ok- I can sign

## 2017-02-09 NOTE — Telephone Encounter (Signed)
See other message from 10/19 regarding RX.

## 2017-03-23 ENCOUNTER — Telehealth: Payer: Self-pay | Admitting: Pulmonary Disease

## 2017-03-23 MED ORDER — AMPHETAMINE-DEXTROAMPHETAMINE 10 MG PO TABS
ORAL_TABLET | ORAL | 0 refills | Status: DC
Start: 2017-03-23 — End: 2017-05-30

## 2017-03-23 NOTE — Telephone Encounter (Signed)
done

## 2017-03-23 NOTE — Telephone Encounter (Signed)
Pt called back-aware Rx placed in mail.

## 2017-03-23 NOTE — Telephone Encounter (Signed)
Pt is requesting refill for Adderall 10mg  to mailed to address on file.  Rx has refilled 02/09/17 #60 with 0 refills.  Last seen 07/12/16 with no pending apt.  RA please advise on refill. Thanks.

## 2017-03-23 NOTE — Telephone Encounter (Signed)
Rx printed and signed by RA. Rx placed in outgoing mail.  LMTCB to inform pt.

## 2017-05-29 ENCOUNTER — Telehealth: Payer: Self-pay | Admitting: Pulmonary Disease

## 2017-05-29 NOTE — Telephone Encounter (Signed)
Pt requesting a refill on Adderal 10mg .   Last refill: 03/23/17 #60 with 0 refills.   Pt requests this be mailed to him- verified address on file.  RA ok to refill?  Thanks!

## 2017-05-30 MED ORDER — AMPHETAMINE-DEXTROAMPHETAMINE 10 MG PO TABS: ORAL_TABLET | ORAL | 0 refills | Status: DC

## 2017-05-30 NOTE — Telephone Encounter (Signed)
rx printed, signed, and placed in outgoing mail to address on file.  Pt aware.  Nothing further needed.

## 2017-05-30 NOTE — Telephone Encounter (Signed)
Okay to refill Please ensure that he has a follow-up q 6 months

## 2017-07-09 ENCOUNTER — Telehealth: Payer: Self-pay | Admitting: Family Medicine

## 2017-07-09 NOTE — Telephone Encounter (Signed)
Called and spoke with pt. Pt advised and voiced understanding.  

## 2017-07-09 NOTE — Telephone Encounter (Signed)
Sorry but I don't know anyone in that area

## 2017-07-09 NOTE — Telephone Encounter (Signed)
Sent to PCP for recommendations  Thanks, [place]

## 2017-07-09 NOTE — Telephone Encounter (Signed)
Copied from CRM [id number]. Topic: Inquiry >> Jul 09, 2017 10:39 AM Jolayne Hainesaylor, Brittany L wrote: Reason for CRM: patient said that him and his wife have moved to [place] & he wants to know if Dr Clent RidgesFry knows of another good dr in that area he recommends he could transfer too Please call [phone]

## 2017-10-04 ENCOUNTER — Telehealth: Payer: Self-pay | Admitting: Pulmonary Disease

## 2017-10-04 NOTE — Telephone Encounter (Signed)
Patient requesting Adderall 10mg , take 1 tab in the morning and 1 tab in the afternoon, refill. Started 06/09/17, #60, no refills. Patient requesting to pick up prescription.  Next OV- 11/26/17 with RA LOV- 07/12/16  No Known Allergies   Current Outpatient Medications on File Prior to Visit  Medication Sig Dispense Refill  . amphetamine-dextroamphetamine (ADDERALL) 10 MG tablet Take 1 tablet in the morning and 1 tablet in the afternoon. 60 tablet 0   No current facility-administered medications on file prior to visit.    RA please advise

## 2017-10-05 MED ORDER — AMPHETAMINE-DEXTROAMPHETAMINE 10 MG PO TABS: ORAL_TABLET | ORAL | 0 refills | Status: DC

## 2017-10-05 NOTE — Telephone Encounter (Signed)
Last office visit was 08/2016. Okay to refill.  He needs office visits annually please

## 2017-10-05 NOTE — Telephone Encounter (Signed)
Attempted to call pt but unable to reach him. Left message for pt to return call x1.  Have printed the script which has been signed by RA. Will keep script in triage until pt returns call.  When pt does return call, pt needs an appt as soon as possible with either RA or an APP.  Per RA, if it has to be an APP, schedule with TP.

## 2017-10-05 NOTE — Telephone Encounter (Signed)
Patient returned call.  Scheduled follow up for 06/18 at 9:00 am with TP.  Please call patient and let him know when he can pick up Adderall RX.  CB is [phone].

## 2017-10-05 NOTE — Telephone Encounter (Signed)
Spoke with pt. He is aware that his prescription is ready to be picked up. Rx has been placed up front for pick up. Nothing further was needed.

## 2017-10-09 ENCOUNTER — Encounter: Payer: Self-pay | Admitting: Pulmonary Disease

## 2017-10-09 ENCOUNTER — Ambulatory Visit: Payer: BLUE CROSS/BLUE SHIELD | Admitting: Pulmonary Disease

## 2017-10-09 VITALS — BP 128/70 | HR 69 | Ht 69.0 in | Wt 147.8 lb

## 2017-10-09 DIAGNOSIS — G4711 Idiopathic hypersomnia with long sleep time: Secondary | ICD-10-CM

## 2017-10-09 MED ORDER — AMPHETAMINE-DEXTROAMPHETAMINE 10 MG PO TABS: ORAL_TABLET | ORAL | 0 refills | Status: DC

## 2017-10-09 NOTE — Patient Instructions (Signed)
Refill Adderall today Complete head CT as initially discussed with Dr. Vassie LollAlva at last appointment Follow-up with Dr. Vassie LollAlva in 4 months    Please contact the office if your symptoms worsen or you have concerns that you are not improving.   Thank you for choosing [facility] for your healthcare, and for allowing us to partner with you on your healthcare journey. I am thankful to be able to provide care to you today.   Elisha HeadlandBrian Mack FNP-C

## 2017-10-09 NOTE — Assessment & Plan Note (Signed)
Refill Adderall today Complete head CT as initially discussed with Dr. Vassie LollAlva at last appointment Follow-up with Dr. Vassie LollAlva in 4 months

## 2017-10-09 NOTE — Progress Notes (Signed)
@Patient  ID: Jonathan Sanders, male    DOB: 1996/02/05, 22 y.o.   MRN: [id number]  Chief Complaint  Patient presents with  . Follow-up    Needed appt for refill on adderall    Referring provider: Nelwyn Salisbury, MD  HPI: Jonathan Sanders is a 22 year old man presents for evaluation of excessive daytime somnolence.  Recent [facility] Encounters:   07/12/16 - Sudie Grumbling Vassie Loll Hypersomnolence Plan: Overnight sleep study followed by MSL, also keep sleep diary for 2 weeks  09/21/16 - OV - Alva Plan:  -Sleep quantity-at least 9 hours of sleep every day -Keep a fixed bedtime and wake up times whenever possible -Exercise regimen daily -naps ok -Trial of Nuvigil 150 mg at 8 AM- call back in 2 weeks to report >>> wasn't covered  >>>started on adderal   Tests:  NPSG 08/29/16 >> neg MSLT 08/30/16 >> latency 2 mins, no SOREMs     Imaging:   Cardiac:   Labs:   Micro:   Chart Review:     10/09/17 OV  Pleasant 22 year old patient seen in office today for follow-up.  Patient reporting that he is doing well on the Adderall and it does help control most of his daytime sleepiness and fatigue.  Patient does report that sometimes he does still have fatigue when he is not focused or working on things during the day.  Patient does say he sleeps about 12 hours a day and does need to set about 15 alarms in order to wake up, or his spouse has to wake him up.  Patient admits he did forget to complete the head CT after last appointment with Dr. Vassie Loll.  Reports father-in-law got sick and he had to cancel the scheduled appointment.    No Known Allergies   There is no immunization history on file for this patient.  Past Medical History:  Diagnosis Date  . Chicken pox     Tobacco History: Social History   Tobacco Use  Smoking Status Never Smoker  Smokeless Tobacco Never Used   Counseling given: Not Answered Continue not smoking  Outpatient Encounter Medications as of 10/09/2017  Medication  Sig  . amphetamine-dextroamphetamine (ADDERALL) 10 MG tablet Take 1 tablet in the morning and 1 tablet in the afternoon.  . [DISCONTINUED] amphetamine-dextroamphetamine (ADDERALL) 10 MG tablet Take 1 tablet in the morning and 1 tablet in the afternoon.   No facility-administered encounter medications on file as of 10/09/2017.      Review of Systems  Constitutional:   No  weight loss, night sweats,  fevers, chills, fatigue, or  lassitude HEENT:   No headaches,  Difficulty swallowing,  Tooth/dental problems, or  Sore throat, No sneezing, itching, ear ache, nasal congestion, post nasal drip  CV: No chest pain,  orthopnea, PND, swelling in lower extremities, anasarca, dizziness, palpitations, syncope  GI: No heartburn, indigestion, abdominal pain, nausea, vomiting, diarrhea, change in bowel habits, loss of appetite, bloody stools Resp: No shortness of breath with exertion or at rest.  No excess mucus, no productive cough,  No non-productive cough,  No coughing up of blood.  No change in color of mucus.  No wheezing.  No chest wall deformity Skin: no rash, lesions, no skin changes. GU: no dysuria, change in color of urine, no urgency or frequency.  No flank pain, no hematuria  MS:  No joint pain or swelling.  No decreased range of motion.  No back pain. Psych:  No change in mood or affect. No depression  or anxiety.  No memory loss.   Physical Exam  BP 128/70   Pulse 69   Ht 5\' 9"  (1.753 m)   Wt 147 lb 12.8 oz (67 kg)   SpO2 99%   BMI 21.83 kg/m   Wt Readings from Last 3 Encounters:  10/09/17 147 lb 12.8 oz (67 kg)  09/21/16 151 lb (68.5 kg)  08/30/16 140 lb (63.5 kg)    GEN: A/Ox3; pleasant , NAD, well nourished    HEENT:  [place]/AT,  EACs-clear, TMs-wnl, NOSE-clear, THROAT-clear, no lesions, no postnasal drip or exudate noted.   NECK:  Supple w/ fair ROM; no JVD; no lymphadenopathy.    RESP:  Clear  P & A; w/o, wheezes/ rales/ or rhonchi. no accessory muscle use, no dullness to  percussion  CARD:  RRR, no m/r/g, no peripheral edema, pulses intact, no cyanosis or clubbing.  GI:   Soft & nt; nml bowel sounds; no organomegaly or masses detected.   Musco: Warm bil, no deformities or joint swelling noted, ambulates well.   Neuro: alert, no focal deficits noted.    Skin: Warm, no lesions or rashes    Lab Results:  CBC    Component Value Date/Time   WBC 9.9 02/01/2015 1541   RBC 5.28 02/01/2015 1541   HGB 15.7 02/01/2015 1541   HCT 45.7 02/01/2015 1541   PLT 180.0 02/01/2015 1541   MCV 86.6 02/01/2015 1541   MCHC 34.3 02/01/2015 1541   RDW 13.2 02/01/2015 1541   LYMPHSABS 2.4 02/01/2015 1541   MONOABS 0.9 02/01/2015 1541   EOSABS 0.6 02/01/2015 1541   BASOSABS 0.0 02/01/2015 1541    BMET    Component Value Date/Time   NA 141 02/01/2015 1541   K 4.6 02/01/2015 1541   CL 101 02/01/2015 1541   CO2 33 (H) 02/01/2015 1541   GLUCOSE 83 02/01/2015 1541   BUN 9 02/01/2015 1541   CREATININE 0.81 02/01/2015 1541   CALCIUM 10.3 02/01/2015 1541    BNP No results found for: BNP  ProBNP No results found for: PROBNP  Imaging: No results found.   Assessment & Plan:   Pleasant patient doing well on exam today.  Will refill Adderall.  Encourage patient to complete head CT as discussed at last appointment with Dr. Vassie LollAlva.  Will bring patient back to see Dr. Vassie LollAlva in 4 months.  Idiopathic hypersomnia with long sleep time Refill Adderall today Complete head CT as initially discussed with Dr. Vassie LollAlva at last appointment Follow-up with Dr. Vassie LollAlva in 4 months    This appointment was 27 minutes long, with 50% of the time in direct face-to-face, assessment, plan of care discussion with patient.  Coral CeoBrian P Mack, NP 10/09/2017

## 2017-10-15 ENCOUNTER — Ambulatory Visit (HOSPITAL_COMMUNITY)
Admission: RE | Admit: 2017-10-15 | Discharge: 2017-10-15 | Disposition: A | Discharge status: Home / Self Care | Payer: BLUE CROSS/BLUE SHIELD | Source: Ambulatory Visit | Attending: Pulmonary Disease | Admitting: Pulmonary Disease

## 2017-10-15 DIAGNOSIS — R413 Other amnesia: Secondary | ICD-10-CM | POA: Diagnosis not present

## 2017-10-15 DIAGNOSIS — G4711 Idiopathic hypersomnia with long sleep time: Secondary | ICD-10-CM | POA: Insufficient documentation

## 2017-10-17 ENCOUNTER — Telehealth: Payer: Self-pay | Admitting: Pulmonary Disease

## 2017-10-17 NOTE — Telephone Encounter (Signed)
Called and spoke with patient, advised him of results. Patient verbalized understanding. Nothing further needed.  

## 2017-11-13 ENCOUNTER — Telehealth: Payer: Self-pay | Admitting: Pulmonary Disease

## 2017-11-13 DIAGNOSIS — G4711 Idiopathic hypersomnia with long sleep time: Secondary | ICD-10-CM

## 2017-11-13 MED ORDER — AMPHETAMINE-DEXTROAMPHETAMINE 10 MG PO TABS: ORAL_TABLET | ORAL | 0 refills | Status: DC

## 2017-11-13 NOTE — Telephone Encounter (Signed)
Spoke with patient. He is aware that the RX has been signed and printed. Will place RX in mail. Nothing else needed at time of call.

## 2017-11-13 NOTE — Telephone Encounter (Signed)
Spoke with patient. He is requesting a refill on his Adderall 10mg . Patient is still taking 1 tablet in the morning and 1 tablet in the afternoon.   Last OV: 10/09/17 with Elisha HeadlandBrian Mack NP Next OV: Recall sent for OV in 01/2018 with RA Last RX: 10/09/17  Patient did state that he wishes to have this RX mailed to him.   RA, please advise if this refill is ok. Thanks!

## 2017-11-13 NOTE — Telephone Encounter (Signed)
Ok to refill 

## 2017-12-18 ENCOUNTER — Telehealth: Payer: Self-pay | Admitting: Pulmonary Disease

## 2017-12-18 DIAGNOSIS — G4711 Idiopathic hypersomnia with long sleep time: Secondary | ICD-10-CM

## 2017-12-18 NOTE — Telephone Encounter (Signed)
Called patient unable to reach left message to give us a call back.

## 2017-12-19 MED ORDER — AMPHETAMINE-DEXTROAMPHETAMINE 10 MG PO TABS: ORAL_TABLET | ORAL | 0 refills | Status: DC

## 2017-12-19 NOTE — Telephone Encounter (Signed)
Spoke with pt. He is needing refill on Adderall. His last OV was on 10/10/16 with Arlys JohnBrian. He does not have a pending OV at this time. Last refill was on 11/13/17 #60.  Per Arlys JohnBrian >> okay to refill medication for a 1 month supply.   Rx has been printed and signed by Arlys JohnBrian. This has been placed in the outgoing mail. Pt is aware. Nothing further is needed at this time.

## 2018-02-08 ENCOUNTER — Telehealth: Payer: Self-pay | Admitting: Pulmonary Disease

## 2018-02-08 DIAGNOSIS — G4711 Idiopathic hypersomnia with long sleep time: Secondary | ICD-10-CM

## 2018-02-08 MED ORDER — AMPHETAMINE-DEXTROAMPHETAMINE 10 MG PO TABS: ORAL_TABLET | ORAL | 0 refills | Status: DC

## 2018-02-08 NOTE — Telephone Encounter (Signed)
rx signed and placed up front for pickup.  Pt aware.  Nothing further needed.  

## 2018-02-08 NOTE — Telephone Encounter (Signed)
Spoke with the pt  He is requesting refilln adderall  He wants to come pick up  He was due for appt and I have scheduled this for 03/08/18 Please advise thanks

## 2018-02-08 NOTE — Telephone Encounter (Signed)
OK to refill

## 2018-03-04 ENCOUNTER — Ambulatory Visit: Payer: BLUE CROSS/BLUE SHIELD | Admitting: Family Medicine

## 2018-03-04 ENCOUNTER — Encounter: Payer: Self-pay | Admitting: Family Medicine

## 2018-03-04 VITALS — BP 110/78 | HR 74 | Temp 98.0°F | Wt 148.6 lb

## 2018-03-04 DIAGNOSIS — N3281 Overactive bladder: Secondary | ICD-10-CM

## 2018-03-04 LAB — POCT URINALYSIS DIPSTICK
Bilirubin, UA: NEGATIVE
Blood, UA: NEGATIVE
Glucose, UA: NEGATIVE
Ketones, UA: NEGATIVE
Leukocytes, UA: NEGATIVE
Nitrite, UA: NEGATIVE
PH UA: 8 (ref 5.0–8.0)
Protein, UA: NEGATIVE
Spec Grav, UA: 1.015 (ref 1.010–1.025)
Urobilinogen, UA: 0.2 E.U./dL

## 2018-03-04 NOTE — Progress Notes (Signed)
   Subjective:    Patient ID: Jonathan Sanders, male    DOB: Feb 22, 1996, 22 y.o.   MRN: [id number]  HPI Here for bladder issues. For years he as had periods of time where he has urinary urgency and has enuresis at night. These come and go but lately this has been more constant. No burning or discomfort. His UA today is clear. BMs are normal.    Review of Systems  Constitutional: Negative.   Respiratory: Negative.   Cardiovascular: Negative.   Genitourinary: Positive for enuresis and urgency. Negative for discharge, dysuria, frequency and hematuria.  Neurological: Negative.        Objective:   Physical Exam  Constitutional: He appears well-developed and well-nourished.  Cardiovascular: Normal rate, regular rhythm, normal heart sounds and intact distal pulses.  Pulmonary/Chest: Effort normal and breath sounds normal.  Abdominal: Soft. Bowel sounds are normal. He exhibits no distension and no mass. There is no tenderness. There is no rebound and no guarding. No hernia.  Genitourinary: Rectum normal and prostate normal.          Assessment & Plan:  Urinary urgency and enuresis. Refer to Urology. Gershon Crane, MD

## 2018-03-08 ENCOUNTER — Ambulatory Visit: Payer: BLUE CROSS/BLUE SHIELD | Admitting: Pulmonary Disease

## 2018-04-09 ENCOUNTER — Ambulatory Visit: Payer: BLUE CROSS/BLUE SHIELD | Admitting: Pulmonary Disease

## 2018-04-09 ENCOUNTER — Encounter: Payer: Self-pay | Admitting: Pulmonary Disease

## 2018-04-09 VITALS — BP 122/82 | HR 72 | Ht 69.0 in | Wt 151.2 lb

## 2018-04-09 DIAGNOSIS — N3944 Nocturnal enuresis: Secondary | ICD-10-CM | POA: Insufficient documentation

## 2018-04-09 DIAGNOSIS — G4711 Idiopathic hypersomnia with long sleep time: Secondary | ICD-10-CM

## 2018-04-09 DIAGNOSIS — G4710 Hypersomnia, unspecified: Secondary | ICD-10-CM

## 2018-04-09 LAB — VITAMIN D 25 HYDROXY (VIT D DEFICIENCY, FRACTURES): VITD: 26.25 ng/mL — ABNORMAL LOW (ref 30.00–100.00)

## 2018-04-09 LAB — TSH: TSH: 3.57 u[IU]/mL (ref 0.35–4.50)

## 2018-04-09 LAB — TESTOSTERONE: Testosterone: 577.48 ng/dL (ref 300.00–890.00)

## 2018-04-09 NOTE — Progress Notes (Signed)
   Subjective:    Patient ID: Jonathan Sanders, male    DOB: 08/25/1995, 22 y.o.   MRN: [id number]  HPI  22 year old for follow-up of idiopathic hypersomnolence He also has a history of sleep paralysis and nocturnal enuresis  Chief Complaint  Patient presents with  . Follow-up    6 month f/u for hypersomnolence. Wants to discuss side effects of Adderall, increased anger. Has been on this medication for the past year.    6211-month follow-up.  He developed increase in aggression and episodes of anger on the past 2 weeks and on his wife's suggestion, stopped taking Adderall and symptoms went away.  He has been off Adderall for about 2 weeks Reports increase in somnolence again to the point where he is drinking drinking 2 to 3 cups of coffee daily and energy drinks especially when he is on his long drives.  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  Unable to squeeze in a nap on his workday  Grandfather had narcolepsy   Significant tests/ events reviewed  NPSG 08/29/16 >> neg MSLT 08/30/16 >> latency 2 mins, no SOREMs  Head CT 09/2017 neg  Review of Systems Patient denies significant dyspnea,cough, hemoptysis,  chest pain, palpitations, pedal edema, orthopnea, paroxysmal nocturnal dyspnea, lightheadedness, nausea, vomiting, abdominal or  leg pains      Objective:   Physical Exam  Gen. Pleasant, well-nourished, in no distress ENT - no thrush, no post nasal drip Neck: No JVD, no thyromegaly, no carotid bruits Lungs: no use of accessory muscles, no dullness to percussion, clear without rales or rhonchi  Cardiovascular: Rhythm regular, heart sounds  normal, no murmurs or gallops, no peripheral edema Musculoskeletal: No deformities, no cyanosis or clubbing        Assessment & Plan:

## 2018-04-09 NOTE — Assessment & Plan Note (Signed)
Discussed behavioral techniques. Avoid caffeinated beverages after 4 PM

## 2018-04-09 NOTE — Patient Instructions (Signed)
We discussed options. Blood work today. Decrease Adderall to 10 mg once daily -can take this between 10 AM to noon, did not take this on the weekends or on holidays when you do not have a long drive

## 2018-04-09 NOTE — Assessment & Plan Note (Signed)
He stills fits into the category of idiopathic hypersomnolence.  Does not have any features of narcolepsy We discussed options including  discontinuing Adderall -I am worried that he is having increased caffeinated beverages and with his long drives he remains a driving risk  Lowering dose of Adderall to once daily-this seems to be the best option, he can take this around 10 AM to noon.  He can miss this on days off or when he does not have long drives  Choosing alternative medication-modafinil was too expensive on his formulary.  I presume that he would have the same side effects with any other stimulant

## 2018-04-09 NOTE — Addendum Note (Signed)
Addended by: Demetrio LappingSANTOS, Zamantha Strebel E on: 04/09/2018 09:49 AM   Modules accepted: Orders

## 2018-04-11 ENCOUNTER — Telehealth: Payer: Self-pay | Admitting: Internal Medicine

## 2018-04-11 NOTE — Telephone Encounter (Signed)
Pt has never seen MR at the office. Pt has only seen Dr. Vassie LollAlva and Elisha HeadlandBrian Mack.  Information stated from last OV with RA 04/09/18: He stills fits into the category of idiopathic hypersomnolence.  Does not have any features of narcolepsy We discussed options including  discontinuing Adderall -I am worried that he is having increased caffeinated beverages and with his long drives he remains a driving risk  Lowering dose of Adderall to once daily-this seems to be the best option, he can take this around 10 AM to noon.  He can miss this on days off or when he does not have long drives  Choosing alternative medication-modafinil was too expensive on his formulary.  I presume that he would have the same side effects with any other stimulant   Called pt but unable to reach him. Left message for pt to return call.

## 2018-04-15 NOTE — Telephone Encounter (Signed)
Called and left message on Patient's VM to call back.

## 2018-04-16 NOTE — Telephone Encounter (Signed)
LMTCB

## 2018-04-18 ENCOUNTER — Telehealth: Payer: Self-pay | Admitting: Pulmonary Disease

## 2018-04-18 NOTE — Telephone Encounter (Signed)
Attempted to call patient today regarding info below. I did not receive an answer at time of call. I have left a voicemail message for pt to return call. X3  Per triage protocol,  This message will be closed and encounter signed today due to we have tried to call patient three time, no response. When patient returns call, a new message will be open to address patient's concerns at that time of call. Nothing further needed at this time of call.

## 2018-04-18 NOTE — Telephone Encounter (Signed)
Please let Dr. Vassie LollAlva refill this. Thanks.

## 2018-04-18 NOTE — Telephone Encounter (Signed)
Called pt back letting him know that RA was out of office until 04/25/18. Stated to pt that once RA returned back to office, we would have him address this refill request with the change in instructions for med. Pt expressed understanding.  Dr. Vassie LollAlva, please advise on this for pt. Thanks!

## 2018-04-18 NOTE — Telephone Encounter (Signed)
Called and spoke with pt. Pt stated at last OV with RA, RA had discussed with him changing his adderall from him taking it twice daily to once daily. Pt thought at his last OV 04/09/18 that the Rx was going to be called in for pt but pt stated that the pharmacy has not received any refill of his med.  Pt would like to have this sent to pharmacy for him having the instructions for pt to take 1 tablet daily with a quantity of 30 tabs instead of a quantity of 60 tabs.  Dr. Vassie LollAlva is out of office until 04/25/18.  Tonya, please advise if you are okay with electronically sending adderall script to pt's pharmacy for pt based on information stated by RA at OV 04/09/18 or if this will need to be addressed by Dr. Vassie LollAlva once he returns to office 04/25/18? Thanks!

## 2018-04-19 ENCOUNTER — Encounter: Payer: Self-pay | Admitting: Pulmonary Disease

## 2018-04-25 MED ORDER — AMPHETAMINE-DEXTROAMPHET ER 10 MG PO CP24: 10.0000 mg | ORAL_CAPSULE | Freq: Every day | ORAL | 0 refills | Status: DC

## 2018-04-25 NOTE — Telephone Encounter (Signed)
Done

## 2018-04-25 NOTE — Telephone Encounter (Signed)
You'll have to use the fingerprint device to send the RX as we are no longer allowed to print RXs for controlled substances.

## 2018-04-25 NOTE — Telephone Encounter (Signed)
Okay to fill with instructions for once daily

## 2018-04-30 ENCOUNTER — Telehealth: Payer: Self-pay | Admitting: Pulmonary Disease

## 2018-04-30 NOTE — Telephone Encounter (Signed)
Spoke with pt, her received a letter in the mail about missing a call about his lab results. I gave him the results below. Pt understood and nothing further is needed.    Notes recorded by Oretha Milch, MD on 04/10/2018 at 9:33 AM EST TSH & testosterone levels are nml Vit D levels are slight low Please ask him to take vit D supplements OTC

## 2018-05-07 ENCOUNTER — Ambulatory Visit (INDEPENDENT_AMBULATORY_CARE_PROVIDER_SITE_OTHER): Payer: BLUE CROSS/BLUE SHIELD | Admitting: Urology

## 2018-05-07 DIAGNOSIS — N3944 Nocturnal enuresis: Secondary | ICD-10-CM

## 2018-06-06 ENCOUNTER — Telehealth: Payer: Self-pay | Admitting: Pulmonary Disease

## 2018-06-06 DIAGNOSIS — G4711 Idiopathic hypersomnia with long sleep time: Secondary | ICD-10-CM

## 2018-06-06 NOTE — Telephone Encounter (Signed)
Last OV 04/09/18 Next follow up is in June of this year. Last refill for Adderall 04/25/18  Dr. Vassie Loll please advise if you are okay to refill.

## 2018-06-07 ENCOUNTER — Telehealth: Payer: Self-pay | Admitting: Pulmonary Disease

## 2018-06-07 DIAGNOSIS — G4711 Idiopathic hypersomnia with long sleep time: Secondary | ICD-10-CM

## 2018-06-07 MED ORDER — AMPHETAMINE-DEXTROAMPHET ER 10 MG PO CP24: 10.0000 mg | ORAL_CAPSULE | Freq: Every day | ORAL | 0 refills | Status: DC

## 2018-06-07 NOTE — Telephone Encounter (Signed)
I called pt to clarify message but he didn't answer the phone. LM for him to call back. I changed his pharmacy to [facility] on [address] RA will you send the Rx for Adderall? Please advise.

## 2018-06-07 NOTE — Telephone Encounter (Signed)
Re ordered

## 2018-06-10 MED ORDER — AMPHETAMINE-DEXTROAMPHETAMINE 10 MG PO TABS: ORAL_TABLET | ORAL | 0 refills | Status: DC

## 2018-06-10 NOTE — Telephone Encounter (Signed)
Called and spoke with [facility].  They are currently out of stock of Adderall. Previous Adderall XR, #30, no refills was sent to [facility] 06/07/18. Patient is requesting [facility] in [place].  Dr Vassie Loll, please advise on new prescription

## 2018-06-14 ENCOUNTER — Telehealth: Payer: Self-pay | Admitting: Pulmonary Disease

## 2018-06-14 NOTE — Telephone Encounter (Signed)
Dr. Vassie Loll please advise on refill. Please review previous message. Pt is requesting refill of his Adderall xr 10mg . Made pt aware that we are awaiting a response from RA.   Jacquiline Doe, LPN       40:98 AM  Note    Called and spoke with [facility].  They are currently out of stock of Adderall. Previous Adderall XR, #30, no refills was sent to [facility] 06/07/18. Patient is requesting [facility] in [place].  Dr Vassie Loll, please advise on new prescription

## 2018-06-14 NOTE — Telephone Encounter (Signed)
I did send it to other pharmacy few days ago - please confirm

## 2018-06-14 NOTE — Telephone Encounter (Signed)
Per pt's chart, the rx was signed as "print" and never routed to the pharmacy.  Called [facility] in [place] and verified that they have never filled a rx for this pt.    RA please advise when rx has been sent to pharmacy.  I have pended the refill in this phone note, so you should be able to just sign it.  Thanks!

## 2018-06-17 MED ORDER — AMPHETAMINE-DEXTROAMPHET ER 10 MG PO CP24: 10.0000 mg | ORAL_CAPSULE | Freq: Every day | ORAL | 0 refills | Status: DC

## 2018-06-17 NOTE — Telephone Encounter (Signed)
Attempted to call [facility] to verify that they were able to receive pt's RX, but it appears they were at lunch. Per his chart, they received confirmation at 135pm.   Spoke with patient. He is aware that the RX has been sent in. Verbalized understanding. Nothing further needed at time of call.

## 2018-06-17 NOTE — Telephone Encounter (Signed)
done

## 2018-06-18 ENCOUNTER — Telehealth: Payer: Self-pay | Admitting: Pulmonary Disease

## 2018-06-18 DIAGNOSIS — G4711 Idiopathic hypersomnia with long sleep time: Secondary | ICD-10-CM

## 2018-06-18 NOTE — Telephone Encounter (Signed)
Called [facility] [place], [place]. Adderall prescription sent 06/17/18, by Dr Vassie Loll, was not filled, because it is not covered by Patient's insurance.  Left message for Patient to call back to clarify pharmacy.

## 2018-06-19 NOTE — Telephone Encounter (Signed)
Called patient, unable to reach. Left message to give us a call back.  

## 2018-06-20 NOTE — Telephone Encounter (Signed)
RA please advise, patients insurance will not cover his Adderall prescription if it is sent to [facility]. He is needing it sent to the [facility] pharmacy on file in [place]. Please advise, thank you.

## 2018-06-21 ENCOUNTER — Telehealth: Payer: Self-pay | Admitting: Pulmonary Disease

## 2018-06-21 MED ORDER — AMPHETAMINE-DEXTROAMPHET ER 10 MG PO CP24: 10.0000 mg | ORAL_CAPSULE | Freq: Every day | ORAL | 0 refills | Status: DC

## 2018-06-21 NOTE — Telephone Encounter (Signed)
This is the third time I am sending in prescription.

## 2018-06-21 NOTE — Telephone Encounter (Signed)
Spoke with pt and relayed that script was sent to preferred pharmacy.  Please see phone note from 06/18/18.  Nothing further is needed.

## 2018-06-21 NOTE — Telephone Encounter (Signed)
lmtcb x1 pt 

## 2018-07-31 ENCOUNTER — Telehealth: Payer: Self-pay | Admitting: Pulmonary Disease

## 2018-07-31 DIAGNOSIS — G4711 Idiopathic hypersomnia with long sleep time: Secondary | ICD-10-CM

## 2018-07-31 MED ORDER — AMPHETAMINE-DEXTROAMPHETAMINE 10 MG PO TABS: ORAL_TABLET | ORAL | 0 refills | Status: DC

## 2018-07-31 NOTE — Addendum Note (Signed)
Addended by: Glenford Bayley on: 07/31/2018 05:12 PM   Modules accepted: Orders

## 2018-07-31 NOTE — Telephone Encounter (Signed)
I'm ok filling, needs follow up when providers are back

## 2018-07-31 NOTE — Telephone Encounter (Signed)
Beth could you please send in refill for Adderall due to controlled med?  Thank you

## 2018-07-31 NOTE — Telephone Encounter (Signed)
All set!

## 2018-07-31 NOTE — Telephone Encounter (Signed)
Pt requesting refill of Adderall. Pt last seen 04/09/18 with instructions for pt to follow up in 6 months.  Called and spoke with pt to clarify which adderall med he was needing a refill of and pt said it was the regular dose of adderall, not the XR 24 hour capsule.  Pt's adderall 10mg  was last filled 06/10/2018 with quantity of 60 for pt to take 1 tab in morning and 1 tab in afternoon.   Beth, please advise if you are okay refilling med for pt and if you are fine using the fingerprint, pharmacy that this needs to be sent to is [facility] in [place].

## 2018-08-12 ENCOUNTER — Telehealth: Payer: Self-pay | Admitting: Pulmonary Disease

## 2018-08-12 DIAGNOSIS — G4711 Idiopathic hypersomnia with long sleep time: Secondary | ICD-10-CM

## 2018-08-12 MED ORDER — AMPHETAMINE-DEXTROAMPHETAMINE 10 MG PO TABS: ORAL_TABLET | ORAL | 0 refills | Status: DC

## 2018-08-12 NOTE — Telephone Encounter (Signed)
Can you please verify that [facility] pharmacy did not fill prescription? If they did not fill please cancel prescription with them and we can send to [facility] in [place]. Thanks

## 2018-08-12 NOTE — Telephone Encounter (Signed)
Called and spoke with pt. Pt stated that [facility] pharmacy was out of adderall 10mg . Rx was originally sent in for pt by Buelah Manis 07/31/2018 #60 with 0 RF  Due to [facility] not having the Rx, pt is requesting to have this sent to [facility] in [place]. Beth, please advise if you are okay resending the Rx of pt's adderall to [facility] in [place]. Thanks!

## 2018-08-12 NOTE — Telephone Encounter (Signed)
Thanks, done.

## 2018-08-12 NOTE — Telephone Encounter (Signed)
Called and spoke with Luther Parody at [place] pharmacy who then had me speak with the pharmacist in regards to pt's 10mg  adderall Rx. Pharmacy was out of the adderall 10mg  so pt has not been able to pick up the Rx. I stated to them to cancel the Rx that was sent in by [place] 07/31/2018 and they stated they would take care of that.

## 2018-10-28 ENCOUNTER — Other Ambulatory Visit: Payer: Self-pay | Admitting: Pulmonary Disease

## 2018-10-28 DIAGNOSIS — G4711 Idiopathic hypersomnia with long sleep time: Secondary | ICD-10-CM

## 2018-10-28 MED ORDER — AMPHETAMINE-DEXTROAMPHETAMINE 10 MG PO TABS: ORAL_TABLET | ORAL | 0 refills | Status: DC

## 2018-10-28 NOTE — Telephone Encounter (Signed)
Yes I have reviewed [place] PMP aware.  Okay to refill.  I have sent this medication to his pharmacy.  Wyn Quaker, FNP

## 2018-10-28 NOTE — Telephone Encounter (Signed)
Called and spoke with pt. Pt is needing a refill of his Adderall 10mg  tablet that he takes 1 tablet twice daily. Med was last filled for pt 08/12/2018 #60 tabs with 0 RF.  Since Aaron Edelman is APP that pt last saw other than visits with RA, sending to [place]. Aaron Edelman, please advise if you are okay refilling med for pt. Thanks!  Assessment & Plan Note by Rigoberto Noel, MD at 04/09/2018 9:45 AM Author: Rigoberto Noel, MD Author Type: Physician Filed: 04/09/2018 9:47 AM  Note Status: Written Cosign: Cosign Not Required Encounter Date: 04/09/2018  Problem: Idiopathic hypersomnia with long sleep time  Editor: Rigoberto Noel, MD (Physician)    He stills fits into the category of idiopathic hypersomnolence.  Does not have any features of narcolepsy We discussed options including  discontinuing Adderall -I am worried that he is having increased caffeinated beverages and with his long drives he remains a driving risk  Lowering dose of Adderall to once daily-this seems to be the best option, he can take this around 10 AM to noon.  He can miss this on days off or when he does not have long drives  Choosing alternative medication-modafinil was too expensive on his formulary.  I presume that he would have the same side effects with any other stimulant    Patient Instructions by Rigoberto Noel, MD at 04/09/2018 9:00 AM Author: Rigoberto Noel, MD Author Type: Physician Filed: 04/09/2018 9:43 AM  Note Status: Signed Cosign: Cosign Not Required Encounter Date: 04/09/2018  Editor: Rigoberto Noel, MD (Physician)    We discussed options. Blood work today. Decrease Adderall to 10 mg once daily -can take this between 10 AM to noon, did not take this on the weekends or on holidays when you do not have a long drive

## 2018-10-28 NOTE — Telephone Encounter (Signed)
Called and left patient a voicemail making him aware that his refill has been sent to the pharmacy.

## 2019-06-15 IMAGING — CT CT HEAD W/O CM
3 series · 16 of 47 positions shown, 19 images · non-contrast
Comparison: None.

CLINICAL DATA: Lethargic for 3-4 years. Short and long-term memory
loss.

EXAM:
CT HEAD WITHOUT CONTRAST
TECHNIQUE: Contiguous axial images were obtained from the base of the skull
through the vertex without intravenous contrast.

[Series 2: head trauma wo · axial · 0.42mm/px · z∈[+1589,+1724]mm · 10 of 33 slices shown, 13 images]
[im 3/33  brain]
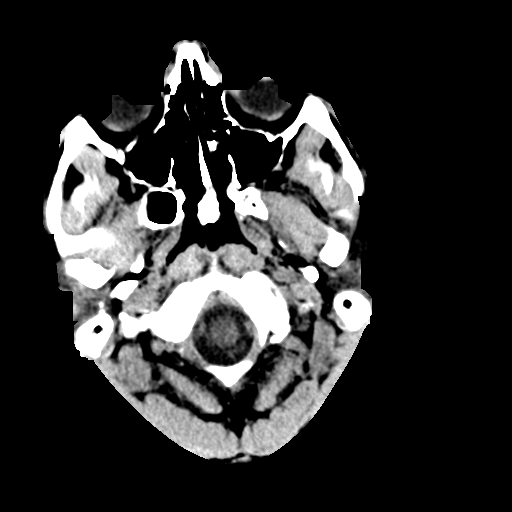
[im 3/33  bone]
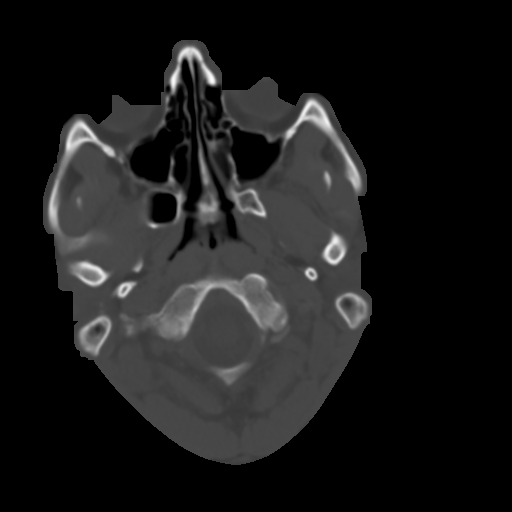
[im 6/33  brain]
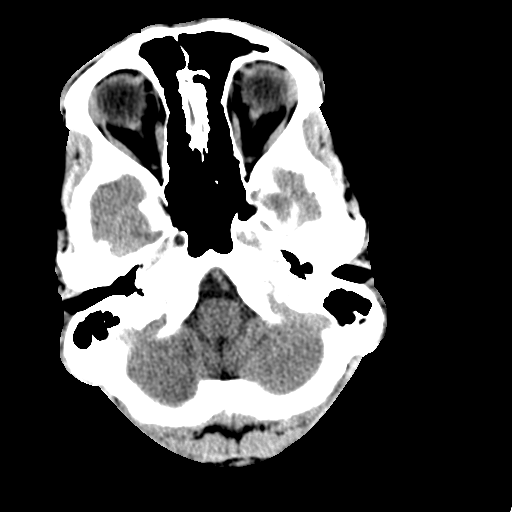
[im 9/33  brain]
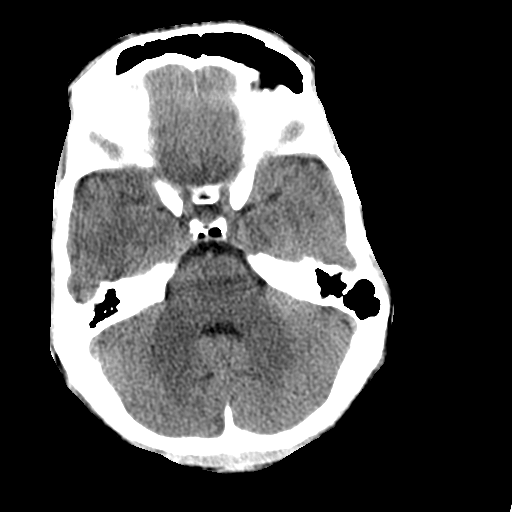
[im 12/33  brain]
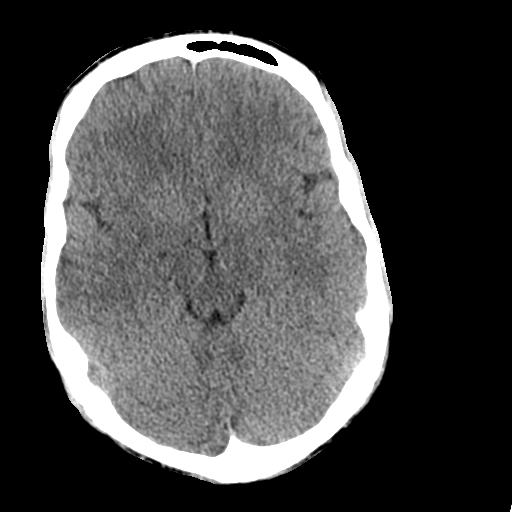
[im 15/33  brain]
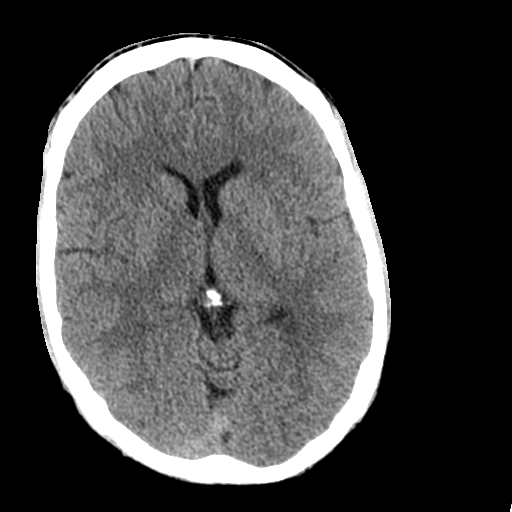
[im 15/33  bone]
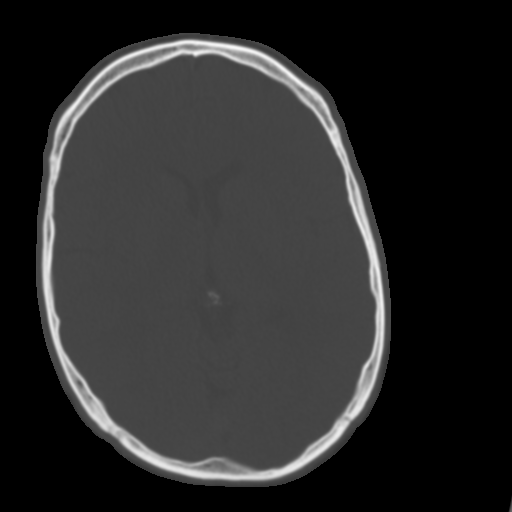
[im 18/33  brain]
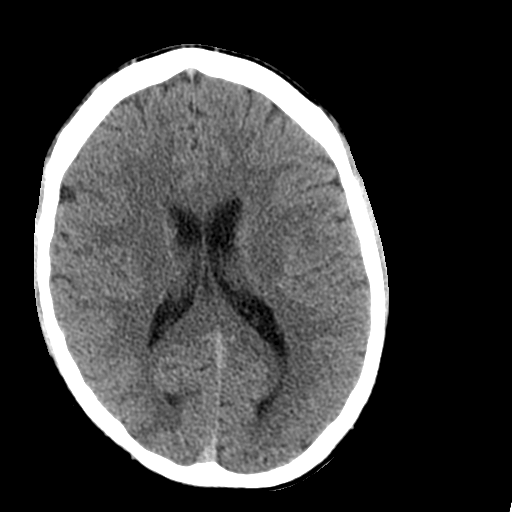
[im 21/33  brain]
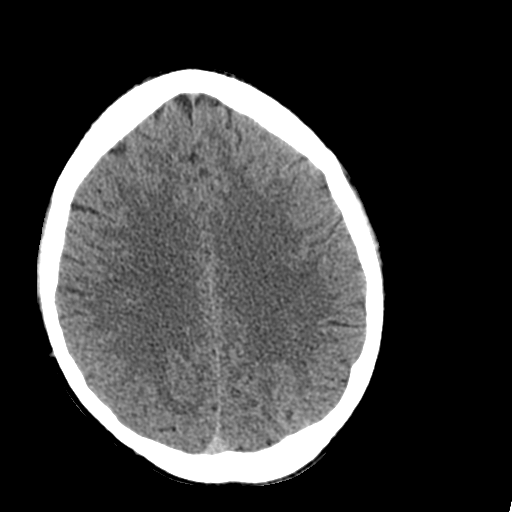
[im 25/33  brain]
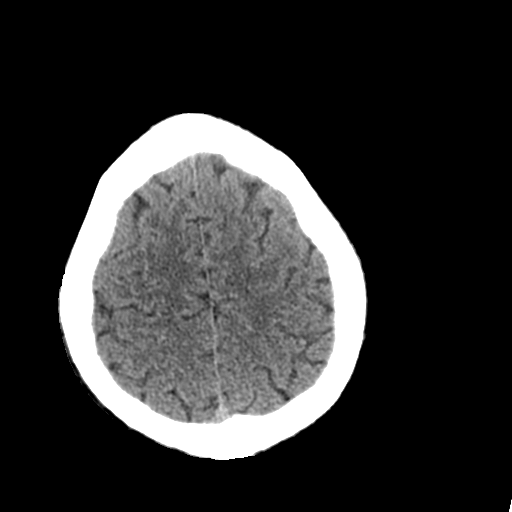
[im 27/33  brain]
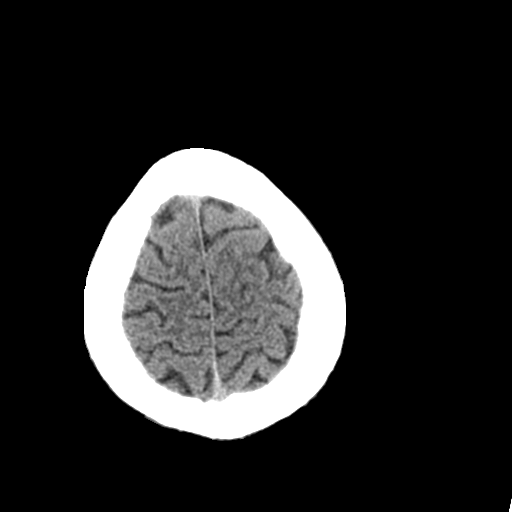
[im 27/33  bone]
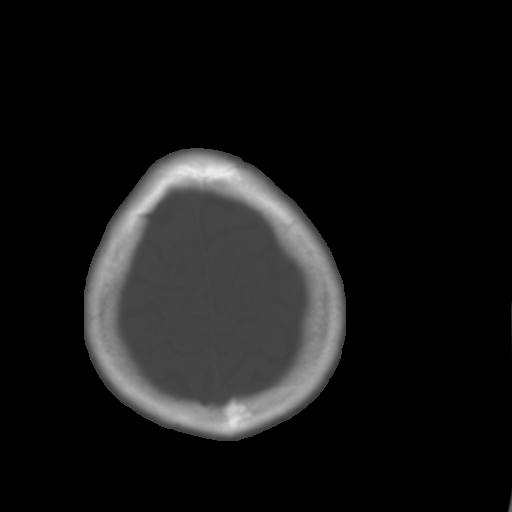
[im 30/33  brain]
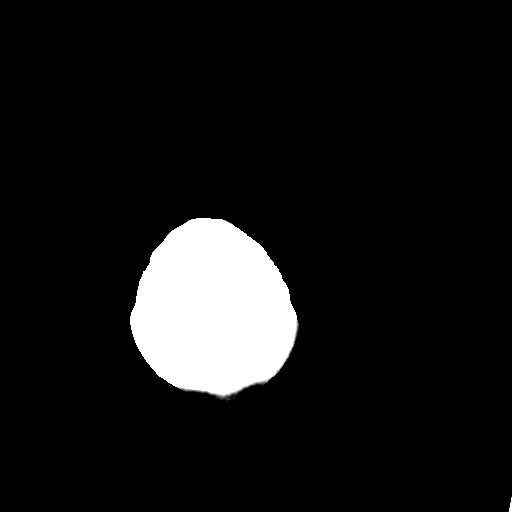

[Series 4: coronal soft tissue · coronal · 0.33mm/px · 3 of 72 slices shown]
[im 24/72  brain]
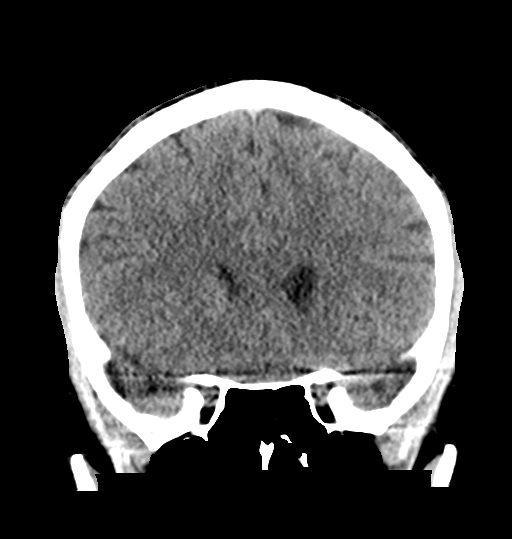
[im 32/72  brain]
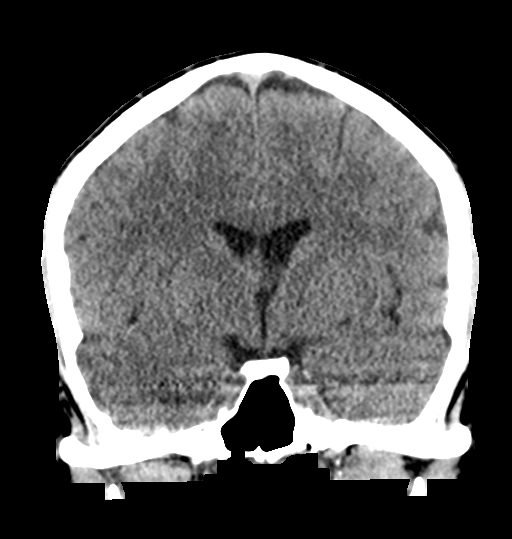
[im 40/72  brain]
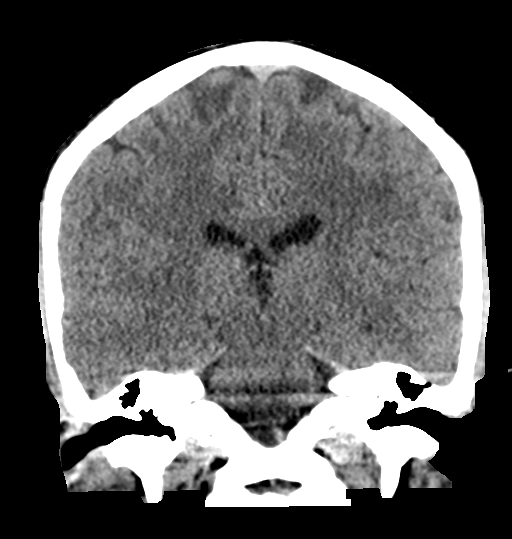

[Series 5: sagittal soft tissue · sagittal · 0.33mm/px · 3 of 57 slices shown]
[im 19/57  brain]
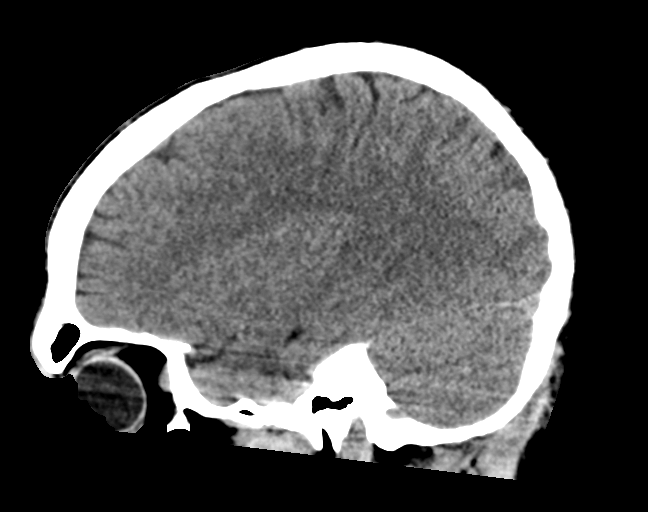
[im 29/57  brain]
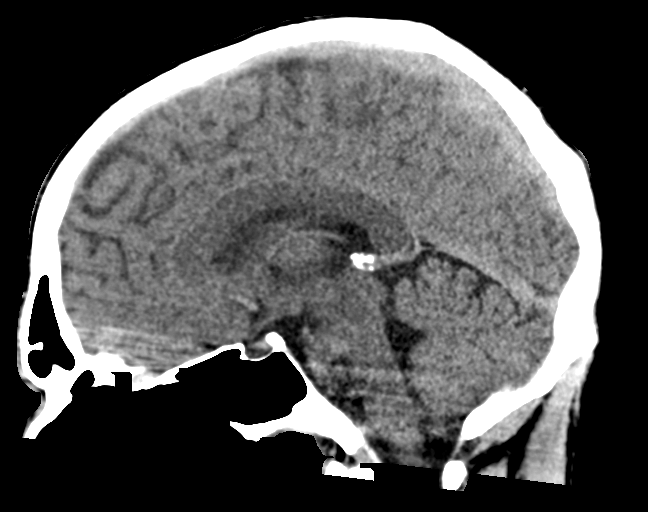
[im 38/57  brain]
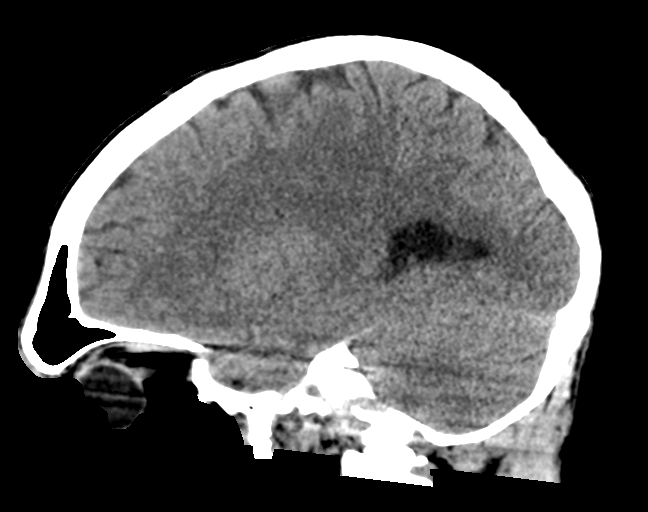

[16 of 47 positions shown; findings below may reference images not displayed]

FINDINGS: Brain: No evidence for acute infarction, hemorrhage, mass lesion,
hydrocephalus, or extra-axial fluid. Normal cerebral volume. No
white matter disease.

Vascular: No hyperdense vessel or unexpected calcification.

Skull: Normal. Negative for fracture or focal lesion.

Sinuses/Orbits: No acute finding.

Other: None.
IMPRESSION: Negative.

## 2020-03-22 ENCOUNTER — Other Ambulatory Visit: Payer: Self-pay

## 2020-03-22 ENCOUNTER — Encounter: Payer: Self-pay | Admitting: Family Medicine

## 2020-03-22 ENCOUNTER — Encounter: Payer: Self-pay | Admitting: Neurology

## 2020-03-22 ENCOUNTER — Ambulatory Visit: Payer: BC Managed Care – PPO | Admitting: Family Medicine

## 2020-03-22 VITALS — BP 108/62 | HR 67 | Temp 98.0°F | Resp 16 | Wt 159.8 lb

## 2020-03-22 DIAGNOSIS — R413 Other amnesia: Secondary | ICD-10-CM

## 2020-03-22 DIAGNOSIS — F4321 Adjustment disorder with depressed mood: Secondary | ICD-10-CM | POA: Diagnosis not present

## 2020-03-22 DIAGNOSIS — R519 Headache, unspecified: Secondary | ICD-10-CM | POA: Diagnosis not present

## 2020-03-22 NOTE — Progress Notes (Signed)
° °  Subjective:    Patient ID: Jonathan Sanders, male    DOB: 11-10-1995, 24 y.o.   MRN: [id number]  HPI Here for several issues. First he asks my advice about seeing a therapist. His father was killed in a motorcycle accident a year ago, and he has had a tough time dealing with this. Also for about 2 years he has had frequent headaches and he has noticed some mild memory loss. The headaches tend to begin in the temples. They are mild and Tylenol takes them away. He averages a headache 5 days a week. No nausea or light sensitivity. He had a normal head CT on 10-16-17.     Review of Systems  Constitutional: Negative.   Respiratory: Negative.   Cardiovascular: Negative.   Neurological: Positive for headaches.       Objective:   Physical Exam Constitutional:      General: He is not in acute distress.    Appearance: He is well-developed.  Eyes:     Extraocular Movements: Extraocular movements intact.     Pupils: Pupils are equal, round, and reactive to light.     Comments: No photophobia   Cardiovascular:     Rate and Rhythm: Normal rate and regular rhythm.  Pulmonary:     Effort: Pulmonary effort is normal.     Breath sounds: Normal breath sounds.  Abdominal:     General: Bowel sounds are normal.     Palpations: Abdomen is soft.  Neurological:     Mental Status: He is alert and oriented to person, place, and time.     Cranial Nerves: No cranial nerve deficit or facial asymmetry.     Coordination: Coordination normal.     Gait: Gait normal.  Psychiatric:        Mood and Affect: Mood normal.        Behavior: Behavior normal.           Assessment & Plan:  For the grief reaction, I gave him information to contact [facility]. For the headaches and memory loss, refer to Neurology. Gershon Crane, MD

## 2020-05-04 ENCOUNTER — Telehealth (INDEPENDENT_AMBULATORY_CARE_PROVIDER_SITE_OTHER): Payer: BC Managed Care – PPO | Admitting: Family Medicine

## 2020-05-04 ENCOUNTER — Encounter: Payer: Self-pay | Admitting: Family Medicine

## 2020-05-04 DIAGNOSIS — J039 Acute tonsillitis, unspecified: Secondary | ICD-10-CM

## 2020-05-04 MED ORDER — CEPHALEXIN 500 MG PO CAPS: 500.0000 mg | ORAL_CAPSULE | Freq: Three times a day (TID) | ORAL | 0 refills | Status: AC

## 2020-05-04 NOTE — Progress Notes (Signed)
   Subjective:    Patient ID: Jonathan Sanders, male    DOB: 10-22-1995, 25 y.o.   MRN: [id number]  HPI Virtual Visit via Telephone Note  I connected with the patient on 05/04/20 at 10:15 AM EST by telephone and verified that I am speaking with the correct person using two identifiers.   I discussed the limitations, risks, security and privacy concerns of performing an evaluation and management service by telephone and the availability of in person appointments. I also discussed with the patient that there may be a patient responsible charge related to this service. The patient expressed understanding and agreed to proceed.  Location patient: home Location provider: work or home office Participants present for the call: patient, provider Patient did not have a visit in the prior 7 days to address this/these issue(s).   History of Present Illness: Here for 2 days of a ST on the right side and for swelling in the back of the throat on the right side. No fever or cough. No body aches or NVD.   Observations/Objective: Patient sounds cheerful and well on the phone. I do not appreciate any SOB. Speech and thought processing are grossly intact. Patient reported vitals:  Assessment and Plan: He likely has a tonsillitis. Treat with Keflex.  Gershon Crane, MD   Follow Up Instructions:     [phone] 5-10 [phone] 11-20 [id number] 21-30 I did not refer this patient for an OV in the next 24 hours for this/these issue(s).  I discussed the assessment and treatment plan with the patient. The patient was provided an opportunity to ask questions and all were answered. The patient agreed with the plan and demonstrated an understanding of the instructions.   The patient was advised to call back or seek an in-person evaluation if the symptoms worsen or if the condition fails to improve as anticipated.  I provided 10 minutes of non-face-to-face time during this encounter.   Gershon Crane, MD    Review of  Systems     Objective:   Physical Exam        Assessment & Plan:

## 2020-05-06 ENCOUNTER — Emergency Department (HOSPITAL_COMMUNITY)
Admission: EM | Admit: 2020-05-06 | Discharge: 2020-05-06 | Disposition: A | Discharge status: Home / Self Care | Payer: BC Managed Care – PPO | Attending: Emergency Medicine | Admitting: Emergency Medicine

## 2020-05-06 ENCOUNTER — Telehealth: Payer: Self-pay | Admitting: Family Medicine

## 2020-05-06 ENCOUNTER — Encounter (HOSPITAL_COMMUNITY): Payer: Self-pay | Admitting: Emergency Medicine

## 2020-05-06 DIAGNOSIS — U071 COVID-19: Secondary | ICD-10-CM | POA: Diagnosis not present

## 2020-05-06 DIAGNOSIS — J029 Acute pharyngitis, unspecified: Secondary | ICD-10-CM | POA: Diagnosis not present

## 2020-05-06 DIAGNOSIS — Z20822 Contact with and (suspected) exposure to covid-19: Secondary | ICD-10-CM

## 2020-05-06 LAB — SARS CORONAVIRUS 2 (TAT 6-24 HRS): SARS Coronavirus 2: POSITIVE — AB

## 2020-05-06 LAB — GROUP A STREP BY PCR: Group A Strep by PCR: NOT DETECTED

## 2020-05-06 MED ORDER — NAPROXEN 500 MG PO TABS: 500.0000 mg | ORAL_TABLET | Freq: Two times a day (BID) | ORAL | 0 refills | Status: DC | PRN

## 2020-05-06 MED ORDER — DEXAMETHASONE SODIUM PHOSPHATE 10 MG/ML IJ SOLN
10.0000 mg | Freq: Once | INTRAMUSCULAR | Status: AC
  Administered 2020-05-06: 10 mg via INTRAMUSCULAR
  Filled 2020-05-06: qty 1

## 2020-05-06 MED ORDER — KETOROLAC TROMETHAMINE 60 MG/2ML IM SOLN
30.0000 mg | Freq: Once | INTRAMUSCULAR | Status: AC
  Administered 2020-05-06: 30 mg via INTRAMUSCULAR
  Filled 2020-05-06: qty 2

## 2020-05-06 NOTE — ED Triage Notes (Signed)
Patient diagnosed with tonsillitis on Tuesday, patient here for complaint of painful swallowing. Patient alert and oriented, no dyspnea, SpO2 on room air 100%.

## 2020-05-06 NOTE — Discharge Instructions (Addendum)
Take the anti-inflammatory as prescribed as needed for pain.  Continue taking your antibiotic prescription.  If you develop throat swelling, difficulty breathing, difficulty tolerating secretions, return immediately to ER for reassessment.  Please check MyChart for your COVID results.  If these are positive, you will need to follow isolation precautions per

## 2020-05-06 NOTE — Telephone Encounter (Signed)
Please clarify prednisone 4mg  directions and quantity.

## 2020-05-06 NOTE — Telephone Encounter (Signed)
I think some prednisone would help take the swelling down. Call in a 4 mg Medrol dose pack

## 2020-05-06 NOTE — Telephone Encounter (Signed)
pts spouse is calling in stating that the pt went to the ER this morning (05/06/2020) about his swollen tonsils and not able to swallow/talk that good and the ER really did not do anything to assist with the issue.  Spouse was just wanting to let Dr. Clent Ridges know and to see if they could get a call back.

## 2020-05-07 MED ORDER — METHYLPREDNISOLONE 4 MG PO TBPK: ORAL_TABLET | ORAL | 0 refills | Status: DC

## 2020-05-07 NOTE — Telephone Encounter (Signed)
I sent it in myself thanks

## 2020-05-07 NOTE — ED Provider Notes (Signed)
[facility] EMERGENCY DEPARTMENT Provider Note   CSN: [id number] Arrival date & time: 05/06/20  2423     History Chief Complaint  Patient presents with  . Sore Throat    Jonathan Sanders is a 25 y.o. male.  Came to ER for evaluation of sore throat.  States that he is been having progressive sore throat over the last few days, was diagnosed via virtual visit by his primary doctor with tonsillitis on Tuesday and given a prescription for cephalexin.  Has not been tested for strep or flu or COVID.  Has not had any significant cough.  No fevers.  No neck swelling or neck pain.  States that he has significant pain when attempting to swallow but is still able to tolerate liquids without any difficulty.  He has been trying over-the-counter meds without significant improvement.  Has been taking Keflex as prescribed.  HPI     Past Medical History:  Diagnosis Date  . Chicken pox     Patient Active Problem List   Diagnosis Date Noted  . Nocturnal enuresis 04/09/2018  . 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 Hemorrhoids 09/07/2015  . Encounter for fitness for duty examination 06/16/2015  . Eczema 02/06/2013  . 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 CONTACT DERMATITIS 02/05/2009  . EPISTAXIS 07/29/2007    History reviewed. No pertinent surgical history.     Family History  Problem Relation Age of Onset  . Hyperlipidemia Other   . Diabetes Other   . Hypertension Other     Social History   Tobacco Use  . Smoking status: Never Smoker  . Smokeless tobacco: Never Used  Substance Use Topics  . Alcohol use: Yes    Alcohol/week: 0.0 standard drinks    Comment: occ  . Drug use: No    Home Medications Prior to Admission medications   Medication Sig Start Date End Date Taking? Authorizing Provider  naproxen (NAPROSYN) 500 MG tablet Take 1 tablet (500 mg total) by mouth 2 (two) times daily as needed for moderate pain. 05/06/20  Yes Danzel Marszalek,  Quitman Livings, MD  cephALEXin (KEFLEX) 500 MG capsule Take 1 capsule (500 mg total) by mouth 3 (three) times daily for 10 days. 05/04/20 05/14/20  Nelwyn Salisbury, MD  methylPREDNISolone (MEDROL DOSEPAK) 4 MG TBPK tablet As directed 05/07/20   Nelwyn Salisbury, MD    Allergies    Patient has no known allergies.  Review of Systems   Review of Systems  Constitutional: Negative for chills and fever.  HENT: Positive for sore throat. Negative for ear pain.   Eyes: Negative for pain and visual disturbance.  Respiratory: Negative for cough and shortness of breath.   Cardiovascular: Negative for chest pain and palpitations.  Gastrointestinal: Negative for abdominal pain and vomiting.  Genitourinary: Negative for dysuria and hematuria.  Musculoskeletal: Negative for arthralgias and back pain.  Skin: Negative for color change and rash.  Neurological: Negative for seizures and syncope.  All other systems reviewed and are negative.   Physical Exam Updated Vital Signs BP 124/70   Pulse 87   Temp 98.3 F (36.8 C) (Oral)   Resp 16   SpO2 98%   Physical Exam Vitals and nursing note reviewed.  Constitutional:      Appearance: He is well-developed and well-nourished.  HENT:     Head: Normocephalic and atraumatic.     Mouth/Throat:     Mouth: Mucous membranes are moist. No oral lesions.  Pharynx: No oropharyngeal exudate.     Comments: Mild erythema in posterior oropharynx but no exudates noted, no abscess noted Eyes:     Conjunctiva/sclera: Conjunctivae normal.  Cardiovascular:     Rate and Rhythm: Normal rate and regular rhythm.     Heart sounds: No murmur heard.   Pulmonary:     Effort: Pulmonary effort is normal. No respiratory distress.     Breath sounds: Normal breath sounds.  Abdominal:     Palpations: Abdomen is soft.     Tenderness: There is no abdominal tenderness.  Musculoskeletal:        General: No edema.     Cervical back: Neck supple.  Skin:    General: Skin is warm  and dry.  Neurological:     Mental Status: He is alert.  Psychiatric:        Mood and Affect: Mood and affect normal.     ED Results / Procedures / Treatments   Labs (all labs ordered are listed, but only abnormal results are displayed) Labs Reviewed  SARS CORONAVIRUS 2 (TAT 6-24 HRS) - Abnormal; Notable for the following components:      Result Value   SARS Coronavirus 2 POSITIVE (*)    All other components within normal limits  GROUP A STREP BY PCR    EKG None  Radiology No results found.  Procedures Procedures (including critical care time)  Medications Ordered in ED Medications  dexamethasone (DECADRON) injection 10 mg (10 mg Intramuscular Given 05/06/20 0852)  ketorolac (TORADOL) injection 30 mg (30 mg Intramuscular Given 05/06/20 7408)    ED Course  I have reviewed the triage vital signs and the nursing notes.  Pertinent labs & imaging results that were available during my care of the patient were reviewed by me and considered in my medical decision making (see chart for details).    MDM Rules/Calculators/A&P                         25 year old otherwise healthy male came to ER with concern for sore throat.  On exam, some erythema in posterior oropharynx but no exudates appreciated.  Normal neck range of motion, tolerating secretions and fluids as well as solids without any significant difficulty. Nothing on exam to suggest RPA or PTA.  Suspect most likely viral etiology.  Already on antibiotics.  Will check COVID and strep testing.  Instructed patient to finish course of antibiotics to cover any sort of bacterial cause.  Counseled on isolation precautions and return precautions should his COVID come back positive.  Recommended checking MyChart for COVID results.    After the discussed management above, the patient was determined to be safe for discharge.  The patient was in agreement with this plan and all questions regarding their care were answered.  ED return  precautions were discussed and the patient will return to the ED with any significant worsening of condition.    Jonathan Sanders was evaluated in Emergency Department on 05/07/2020 for the symptoms described in the history of present illness. He was evaluated in the context of the global COVID-19 pandemic, which necessitated consideration that the patient might be at risk for infection with the SARS-CoV-2 virus that causes COVID-19. Institutional protocols and algorithms that pertain to the evaluation of patients at risk for COVID-19 are in a state of rapid change based on information released by regulatory bodies including the CDC and federal and state organizations. These policies and algorithms were  followed during the patient's care in the ED.   Final Clinical Impression(s) / ED Diagnoses Final diagnoses:  Pharyngitis, unspecified etiology  Suspected COVID-19 virus infection    Rx / DC Orders ED Discharge Orders         Ordered    naproxen (NAPROSYN) 500 MG tablet  2 times daily PRN        05/06/20 0841           Milagros Loll, MD 05/07/20 1138

## 2020-05-11 ENCOUNTER — Encounter: Payer: Self-pay | Admitting: Family Medicine

## 2020-05-11 NOTE — Telephone Encounter (Signed)
He can return to work 10 days after his symptoms began, provided he is feeling better

## 2020-05-12 NOTE — Telephone Encounter (Signed)
Yes please make him a note for the days he missed work. No need to retest for he Covid virus unless his employer is requiring this

## 2020-05-13 NOTE — Telephone Encounter (Signed)
Please send him a work note for these days

## 2020-05-24 ENCOUNTER — Ambulatory Visit: Payer: BC Managed Care – PPO | Admitting: Neurology

## 2020-07-23 ENCOUNTER — Ambulatory Visit: Payer: BC Managed Care – PPO | Admitting: Neurology

## 2020-09-27 ENCOUNTER — Ambulatory Visit: Payer: BC Managed Care – PPO | Admitting: Neurology

## 2021-02-25 DIAGNOSIS — M5386 Other specified dorsopathies, lumbar region: Secondary | ICD-10-CM | POA: Diagnosis not present

## 2021-02-25 DIAGNOSIS — S233XXA Sprain of ligaments of thoracic spine, initial encounter: Secondary | ICD-10-CM | POA: Diagnosis not present

## 2021-02-25 DIAGNOSIS — S134XXA Sprain of ligaments of cervical spine, initial encounter: Secondary | ICD-10-CM | POA: Diagnosis not present

## 2021-02-25 DIAGNOSIS — S335XXA Sprain of ligaments of lumbar spine, initial encounter: Secondary | ICD-10-CM | POA: Diagnosis not present

## 2021-03-07 DIAGNOSIS — S134XXA Sprain of ligaments of cervical spine, initial encounter: Secondary | ICD-10-CM | POA: Diagnosis not present

## 2021-03-07 DIAGNOSIS — S233XXA Sprain of ligaments of thoracic spine, initial encounter: Secondary | ICD-10-CM | POA: Diagnosis not present

## 2021-03-07 DIAGNOSIS — M5386 Other specified dorsopathies, lumbar region: Secondary | ICD-10-CM | POA: Diagnosis not present

## 2021-03-07 DIAGNOSIS — S335XXA Sprain of ligaments of lumbar spine, initial encounter: Secondary | ICD-10-CM | POA: Diagnosis not present

## 2021-07-28 ENCOUNTER — Encounter: Payer: Self-pay | Admitting: Family Medicine

## 2021-07-28 ENCOUNTER — Telehealth (INDEPENDENT_AMBULATORY_CARE_PROVIDER_SITE_OTHER): Payer: BC Managed Care – PPO | Admitting: Family Medicine

## 2021-07-28 VITALS — Temp 98.0°F

## 2021-07-28 DIAGNOSIS — J019 Acute sinusitis, unspecified: Secondary | ICD-10-CM | POA: Diagnosis not present

## 2021-07-28 MED ORDER — AZITHROMYCIN 250 MG PO TABS: ORAL_TABLET | ORAL | 0 refills | Status: DC

## 2021-07-28 NOTE — Progress Notes (Signed)
? ?  Subjective:  ? ? Patient ID: Jonathan Sanders, male    DOB: 1995-09-17, 26 y.o.   MRN: [id number] ? ?HPI ?Virtual Visit via Video Note ? ?I connected with the patient on 07/28/21 at  9:30 AM EDT by a video enabled telemedicine application and verified that I am speaking with the correct person using two identifiers. ? Location patient: home ?Location provider:work or home office ?Persons participating in the virtual visit: patient, provider ? ?I discussed the limitations of evaluation and management by telemedicine and the availability of in person appointments. The patient expressed understanding and agreed to proceed. ? ? ?HPI: ?Here for 3 days of sinus pressure, PND, blowing green mucus from the nose, and a dry cough. No fever. Using Allegra.  ? ? ?ROS: See pertinent positives and negatives per HPI. ? ?Past Medical History:  ?Diagnosis Date  ? Chicken pox   ? ? ?History reviewed. No pertinent surgical history. ? ?Family History  ?Problem Relation Age of Onset  ? Hyperlipidemia Other   ? Diabetes Other   ? Hypertension Other   ? ? ? ?Current Outpatient Medications:  ?  azithromycin (ZITHROMAX Z-PAK) 250 MG tablet, As directed, Disp: 6 each, Rfl: 0 ? ?EXAM: ? ?VITALS per patient if applicable: ? ?GENERAL: alert, oriented, appears well and in no acute distress ? ?HEENT: atraumatic, conjunttiva clear, no obvious abnormalities on inspection of external nose and ears ? ?NECK: normal movements of the head and neck ? ?LUNGS: on inspection no signs of respiratory distress, breathing rate appears normal, no obvious gross SOB, gasping or wheezing ? ?CV: no obvious cyanosis ? ?MS: moves all visible extremities without noticeable abnormality ? ?PSYCH/NEURO: pleasant and cooperative, no obvious depression or anxiety, speech and thought processing grossly intact ? ?ASSESSMENT AND PLAN: ?Sinusitis, treat with a Zpack. Add Mucinex BID. ?Alysia Penna, MD ? ?Discussed the following assessment and plan: ? ?No diagnosis found. ? ? ?   ?I discussed the assessment and treatment plan with the patient. The patient was provided an opportunity to ask questions and all were answered. The patient agreed with the plan and demonstrated an understanding of the instructions. ?  ?The patient was advised to call back or seek an in-person evaluation if the symptoms worsen or if the condition fails to improve as anticipated. ? ?  ? ? ?Review of Systems ? ?   ?Objective:  ? Physical Exam ? ? ? ? ?   ?Assessment & Plan:  ? ? ?

## 2021-08-17 ENCOUNTER — Encounter: Payer: Self-pay | Admitting: Family Medicine

## 2021-08-17 ENCOUNTER — Ambulatory Visit: Payer: BC Managed Care – PPO | Admitting: Family Medicine

## 2021-08-17 VITALS — BP 98/70 | HR 66 | Temp 98.0°F | Ht 69.0 in | Wt 154.2 lb

## 2021-08-17 DIAGNOSIS — J40 Bronchitis, not specified as acute or chronic: Secondary | ICD-10-CM | POA: Diagnosis not present

## 2021-08-17 MED ORDER — DOXYCYCLINE HYCLATE 100 MG PO CAPS
100.0000 mg | ORAL_CAPSULE | Freq: Two times a day (BID) | ORAL | 0 refills | Status: AC
Start: 2021-08-17 — End: 2021-08-27

## 2021-08-17 NOTE — Progress Notes (Signed)
? ?  Subjective:  ? ? Patient ID: Jonathan Sanders, male    DOB: 1996/03/08, 26 y.o.   MRN: [id number] ? ?HPI ?Here for one week of a dry cough, some wheezing and a ST. No fever or SOB. We treated him for a sinusitis on 07-28-21 with a Zpack, and this worked well. He felt okay for about a week until this started. He has been taking Delsym, Claritin, and Mucinex.  ? ? ?Review of Systems  ?Constitutional: Negative.   ?HENT:  Positive for sore throat. Negative for ear pain, postnasal drip and sinus pain.   ?Eyes: Negative.   ?Respiratory:  Positive for cough and wheezing. Negative for shortness of breath.   ? ?   ?Objective:  ? Physical Exam ?Constitutional:   ?   Appearance: Normal appearance. He is not ill-appearing.  ?HENT:  ?   Right Ear: Tympanic membrane, ear canal and external ear normal.  ?   Left Ear: Tympanic membrane, ear canal and external ear normal.  ?   Nose: Nose normal.  ?   Mouth/Throat:  ?   Pharynx: Oropharynx is clear.  ?Eyes:  ?   Conjunctiva/sclera: Conjunctivae normal.  ?Pulmonary:  ?   Effort: Pulmonary effort is normal.  ?   Breath sounds: Normal breath sounds.  ?Lymphadenopathy:  ?   Cervical: No cervical adenopathy.  ?Neurological:  ?   Mental Status: He is alert.  ? ? ? ? ? ?   ?Assessment & Plan:  ?Bronchitis, treat with Doxycycline for 10 days . ?Gershon Crane, MD ? ? ?

## 2022-02-15 ENCOUNTER — Encounter: Payer: Self-pay | Admitting: Family Medicine

## 2022-02-15 ENCOUNTER — Ambulatory Visit: Payer: BC Managed Care – PPO | Admitting: Family Medicine

## 2022-02-15 VITALS — BP 118/80 | HR 68 | Temp 98.2°F | Wt 157.0 lb

## 2022-02-15 DIAGNOSIS — K648 Other hemorrhoids: Secondary | ICD-10-CM | POA: Diagnosis not present

## 2022-02-15 MED ORDER — HYDROCORTISONE ACETATE 25 MG RE SUPP: 25.0000 mg | Freq: Two times a day (BID) | RECTAL | 3 refills | Status: DC

## 2022-02-15 NOTE — Progress Notes (Signed)
   Subjective:    Patient ID: Jonathan Sanders, male    DOB: 06/02/1995, 26 y.o.   MRN: [id number]  HPI Here for one week of swelling and pain at the anus. He has a few hemorrhoids that bleed when he passes a stool. His stools are often hard.    Review of Systems  Constitutional: Negative.   Respiratory: Negative.    Cardiovascular: Negative.   Gastrointestinal:  Positive for anal bleeding and rectal pain.       Objective:   Physical Exam Constitutional:      Appearance: Normal appearance.  Cardiovascular:     Rate and Rhythm: Normal rate and regular rhythm.     Pulses: Normal pulses.     Heart sounds: Normal heart sounds.  Pulmonary:     Effort: Pulmonary effort is normal.     Breath sounds: Normal breath sounds.  Genitourinary:    Comments: External exam is normal.  Neurological:     Mental Status: He is alert.           Assessment & Plan:  Internal hemorrhoids. He can use Anusol suppositories BID as needed. He will begin using Miralax daily to keep the stools soft.  Alysia Penna, MD

## 2022-07-05 ENCOUNTER — Ambulatory Visit: Payer: BC Managed Care – PPO | Admitting: Family Medicine

## 2022-07-05 ENCOUNTER — Encounter: Payer: Self-pay | Admitting: Family Medicine

## 2022-07-05 VITALS — BP 110/70 | HR 73 | Temp 98.3°F | Wt 160.0 lb

## 2022-07-05 DIAGNOSIS — K625 Hemorrhage of anus and rectum: Secondary | ICD-10-CM

## 2022-07-05 NOTE — Progress Notes (Signed)
   Subjective:    Patient ID: Jonathan Sanders, male    DOB: 1996-01-23, 27 y.o.   MRN: [id number]  HPI Here for continued rectal bleeding. We saw him for this a few weeks ago and gave him some hydrocortisone suppositories to try, but these have not helped. If anything he is seeing more bright red blood that he did before. There is not much pain. He has started using stool softeners as well.    Review of Systems  Constitutional: Negative.   Respiratory: Negative.    Cardiovascular: Negative.   Gastrointestinal:  Positive for anal bleeding.       Objective:   Physical Exam Constitutional:      Appearance: Normal appearance.  Cardiovascular:     Rate and Rhythm: Normal rate and regular rhythm.     Pulses: Normal pulses.     Heart sounds: Normal heart sounds.  Pulmonary:     Effort: Pulmonary effort is normal.     Breath sounds: Normal breath sounds.  Neurological:     Mental Status: He is alert.           Assessment & Plan:  Rectal bleeding, we will refer him to GI to evaluate.  Alysia Penna, MD

## 2022-07-13 NOTE — Progress Notes (Signed)
Referring Provider: Laurey Morale, MD  Primary Care Physician:  Laurey Morale, MD Primary Gastroenterologist:  Dr. Gala Romney  Chief Complaint  Patient presents with   Hemorrhoids    Has had a hemorrhoid for a couple years. It has gotten worse and is bleeding now.     HPI:   Jonathan Sanders is a 27 y.o. male presenting today at the request of Laurey Morale, MD for rectal bleeding.   Reviewed most recent office visit with PCP 07/05/2022.  Patient had previously been seen for rectal bleeding in October 2023 and was given hydrocortisone suppositories, but these did not improve his symptoms.  Reported he continued with rectal bleeding with increasing symptoms.  Recommended seeing GI.  Today:  Has had hemorrhoids for about 4 years with intermittent rectal bleeding for about 2 years, but symptoms are worsening.  Having bleeding at any time he wipes but is also been having intermittent bleeding during the day while he is not in the bathroom.  He has bright red blood on toilet tissue and some blood in the water.  He reports sharp knifelike pain along with intermittent burning.  He was struggling with constipation, but has not had any trouble with this in about 2 years.  He changed his diet, started drinking more water, eating more fiber, and was using stool softeners, but has not required stool softeners in about 1 month.  His bowels are moving well, soft, formed, daily.  No regular diarrhea.  No straining.  He was prescribed a hemorrhoid cream along with suppositories as primary care provider.  The hemorrhoid cream helped with burning, but did not help with pain or bleeding.  Felt suppositories worsen the bleeding.  No known family history of colon cancer, colon polyps, or IBD.  No significant upper GI symptoms such as nausea, vomiting, reflux symptoms, or dysphagia.  He has been gaining weight.  Past Medical History:  Diagnosis Date   Chicken pox     History reviewed. No pertinent surgical  history.  Current Outpatient Medications  Medication Sig Dispense Refill   hydrocortisone (ANUSOL-HC) 2.5 % rectal cream Place 1 Application rectally 4 (four) times daily. [facility] Hemorrhoid Cream Compounded With 0.125% Nitroglycerine. 30 g 1   No current facility-administered medications for this visit.    Allergies as of 07/14/2022   (No Known Allergies)    Family History  Problem Relation Age of Onset   Hyperlipidemia Other    Diabetes Other    Hypertension Other    Colon cancer Neg Hx    Colon polyps Neg Hx    Inflammatory bowel disease Neg Hx     Social History   Socioeconomic History   Marital status: Married    Spouse name: Not on file   Number of children: Not on file   Years of education: Not on file   Highest education level: Not on file  Occupational History   Not on file  Tobacco Use   Smoking status: Never   Smokeless tobacco: Never  Substance and Sexual Activity   Alcohol use: Yes    Alcohol/week: 0.0 standard drinks of alcohol    Comment: occ   Drug use: No   Sexual activity: Not on file  Other Topics Concern   Not on file  Social History Narrative   Not on file   Social Determinants of Health   Financial Resource Strain: Not on file  Food Insecurity: Not on file  Transportation Needs: Not on file  Physical Activity: Not on file  Stress: Not on file  Social Connections: Not on file  Intimate Partner Violence: Not on file    Review of Systems: Gen: Denies any fever, chills, cold or flulike symptoms, presyncope, syncope. CV: Denies chest pain, heart palpitations. Resp: Denies shortness of breath, cough. GI: See HPI GU : Denies urinary burning, urinary frequency, urinary hesitancy MS: Denies joint pain. Derm: Denies rash. Psych: Denies depression, anxiety. Heme: See HPI  Physical Exam: BP 117/71 (BP Location: Left Arm, Patient Position: Sitting, Cuff Size: Normal)   Pulse 76   Temp 97.6 F (36.4 C) (Temporal)   Ht 5\' 8"   (1.727 m)   Wt 161 lb 3.2 oz (73.1 kg)   SpO2 98%   BMI 24.51 kg/m  General:   Alert and oriented. Pleasant and cooperative. Well-nourished and well-developed.  Head:  Normocephalic and atraumatic. Eyes:  Without icterus, sclera clear and conjunctiva pink.  Ears:  Normal auditory acuity. Lungs:  Clear to auscultation bilaterally. No wheezes, rales, or rhonchi. No distress.  Heart:  S1, S2 present without murmurs appreciated.  Abdomen:  +BS, soft, non-tender and non-distended. No HSM noted. No guarding or rebound. No masses appreciated.  Rectal: Few perianal excoriations, internal hemorrhoids with slight prolapse, anal fissure in the 5 o'clock position.  No bright red blood or melena noted on exam. Msk:  Symmetrical without gross deformities. Normal posture. Extremities:  Without edema. Neurologic:  Alert and  oriented x4;  grossly normal neurologically. Skin:  Intact without significant lesions or rashes. Psych:  Normal mood and affect.    Assessment:  27 year old male with no significant past medical history, presenting today for further evaluation of rectal bleeding.  He reports hemorrhoids x 4 years with intermittent rectal bleeding x 2 years that has been worsening.  Associated rectal burning and knifelike pain.  History of constipation, but this resolved with dietary changes and stool softeners though he has not required stool softeners in at least a month.  Prior trial of hemorrhoid cream and hemorrhoid suppositories with no improvement in rectal bleeding.  On exam today, he has internal hemorrhoids with slight prolapse along with an anal fissure in the 5 o'clock position.  I suspect anal fissure is the etiology of his persistent pain and bleeding.  We will treat him with [facility] hemorrhoid cream.  If rectal bleeding continues despite healing of anal fissure, would recommend colonoscopy to rule out other etiologies such as polyps or malignancy, then could consider hemorrhoid  banding.  We will go ahead and update CBC in light of chronic bleeding.   Plan:  CBC [facility] hemorrhoid cream with 0.125% nitroglycerin 4 times daily. Start Benefiber 2 teaspoons daily x 2 weeks, then increase to twice daily. Limit toilet time to 2-3 minutes. Avoid straining. Patient will let me know of any recurrent constipation. Follow-up in 4 weeks.   Aliene Altes, PA-C [facility] 07/14/2022

## 2022-07-14 ENCOUNTER — Ambulatory Visit: Payer: BC Managed Care – PPO | Admitting: Gastroenterology

## 2022-07-14 ENCOUNTER — Encounter: Payer: Self-pay | Admitting: Gastroenterology

## 2022-07-14 ENCOUNTER — Other Ambulatory Visit (HOSPITAL_COMMUNITY)
Admission: RE | Admit: 2022-07-14 | Discharge: 2022-07-14 | Disposition: A | Discharge status: Home / Self Care | Payer: BC Managed Care – PPO | Source: Ambulatory Visit | Attending: Internal Medicine | Admitting: Internal Medicine

## 2022-07-14 VITALS — BP 117/71 | HR 76 | Temp 97.6°F | Ht 68.0 in | Wt 161.2 lb

## 2022-07-14 DIAGNOSIS — K625 Hemorrhage of anus and rectum: Secondary | ICD-10-CM | POA: Insufficient documentation

## 2022-07-14 DIAGNOSIS — K602 Anal fissure, unspecified: Secondary | ICD-10-CM

## 2022-07-14 DIAGNOSIS — K649 Unspecified hemorrhoids: Secondary | ICD-10-CM

## 2022-07-14 LAB — CBC WITH DIFFERENTIAL/PLATELET
Abs Immature Granulocytes: 0.01 10*3/uL (ref 0.00–0.07)
Basophils Absolute: 0 10*3/uL (ref 0.0–0.1)
Basophils Relative: 1 %
Eosinophils Absolute: 0.3 10*3/uL (ref 0.0–0.5)
Eosinophils Relative: 6 %
HCT: 46 % (ref 39.0–52.0)
Hemoglobin: 15.7 g/dL (ref 13.0–17.0)
Immature Granulocytes: 0 %
Lymphocytes Relative: 33 %
Lymphs Abs: 1.8 10*3/uL (ref 0.7–4.0)
MCH: 29.6 pg (ref 26.0–34.0)
MCHC: 34.1 g/dL (ref 30.0–36.0)
MCV: 86.8 fL (ref 80.0–100.0)
Monocytes Absolute: 0.5 10*3/uL (ref 0.1–1.0)
Monocytes Relative: 9 %
Neutro Abs: 2.7 10*3/uL (ref 1.7–7.7)
Neutrophils Relative %: 51 %
Platelets: 204 10*3/uL (ref 150–400)
RBC: 5.3 MIL/uL (ref 4.22–5.81)
RDW: 13.2 % (ref 11.5–15.5)
WBC: 5.2 10*3/uL (ref 4.0–10.5)
nRBC: 0 % (ref 0.0–0.2)

## 2022-07-14 MED ORDER — HYDROCORTISONE (PERIANAL) 2.5 % EX CREA: 1.0000 | TOPICAL_CREAM | Freq: Four times a day (QID) | CUTANEOUS | 1 refills | Status: DC

## 2022-07-14 NOTE — Patient Instructions (Addendum)
Please have blood work completed at [facility] to check your hemoglobin.  I am calling in a hemorrhoid cream compounded with nitroglycerine for your hemorrhoids and anal fissure to [facility].  The pharmacy should give you a call once the cream is ready to pick up.  You will apply the cream 4 times daily for at least the next 2 weeks, but we need to continue applying the cream for an additional 2 weeks beyond symptom resolution.  It is possible we could be using this cream for several weeks.  Be sure to limit toilet time to 2-3 minutes and avoid straining. It is important that we do not have any constipation.  Let me know if you start to have any trouble with this. I do recommend that you start Benefiber 2 teaspoons daily x 2 weeks, then increase to twice daily to help with bowel regularity and stool evacuation.  I will plan to see you back in about 4 weeks to reevaluate and to see if your anal fissure has healed.  It was good to meet today!  Aliene Altes, PA-C [facility]

## 2022-07-17 ENCOUNTER — Encounter: Payer: Self-pay | Admitting: Gastroenterology

## 2022-08-11 NOTE — Progress Notes (Signed)
Referring Provider: Nelwyn Salisbury, MD Primary Care Physician:  Nelwyn Salisbury, MD Primary GI Physician: Dr. Jena Gauss  Chief Complaint  Patient presents with   Follow-up    Follow up. No problems     HPI:   Jonathan Sanders is a 27 y.o. male presenting today for follow-up of rectal bleeding in the setting of hemorrhoids and anal fissure.   Last seen in our office 07/14/2022 reporting 4-year history of hemorrhoids with intermittent rectal bleeding x 2 years that has been worsening.  Associated rectal burning and knifelike pain not improving with hemorrhoid creams/suppositories.  Plan to update CBC, start [facility] hemorrhoid cream compounded with 0.125% nitroglycerin 4 times daily, Benefiber daily, and follow-up in 4 weeks.  If rectal bleeding continues despite healing of anal fissure, recommend colonoscopy.  CBC showed hemoglobin of 15.7.  Today: Rectal pain has resolved.  Continues to have intermittent toilet tissue hematochezia, but overall rectal bleeding has significantly improved.  Bleeding only occurs when he has a hard stool, maybe once a week.  Used to take a stool softener daily, but ran out over a month ago and has not picked any more out.  He is not using the hemorrhoid cream at this time.    Past Medical History:  Diagnosis Date   Chicken pox     History reviewed. No pertinent surgical history.  Current Outpatient Medications  Medication Sig Dispense Refill   hydrocortisone (ANUSOL-HC) 2.5 % rectal cream Place 1 Application rectally 4 (four) times daily. [facility] Hemorrhoid Cream Compounded With 0.125% Nitroglycerine. (Patient not taking: Reported on 08/14/2022) 30 g 1   No current facility-administered medications for this visit.    Allergies as of 08/14/2022   (No Known Allergies)    Family History  Problem Relation Age of Onset   Colon polyps Mother        65s   Hyperlipidemia Other    Diabetes Other    Hypertension Other    Colon  cancer Neg Hx    Inflammatory bowel disease Neg Hx     Social History   Socioeconomic History   Marital status: Married    Spouse name: Not on file   Number of children: Not on file   Years of education: Not on file   Highest education level: Not on file  Occupational History   Not on file  Tobacco Use   Smoking status: Never   Smokeless tobacco: Never  Substance and Sexual Activity   Alcohol use: Yes    Alcohol/week: 0.0 standard drinks of alcohol    Comment: occ   Drug use: No   Sexual activity: Not on file  Other Topics Concern   Not on file  Social History Narrative   Not on file   Social Determinants of Health   Financial Resource Strain: Not on file  Food Insecurity: Not on file  Transportation Needs: Not on file  Physical Activity: Not on file  Stress: Not on file  Social Connections: Not on file    Review of Systems: Gen: Denies fever, chills, cold flulike symptoms, presyncope, syncope. CV: Denies chest pain, palpitations. Resp: Denies dyspnea, cough GI: See HPI Heme: See HPI  Physical Exam: BP 113/77 (BP Location: Left Arm, Patient Position: Sitting, Cuff Size: Normal)   Pulse 77   Temp 97.7 F (36.5 C) (Temporal)   Ht  (1.727 m)   Wt 163 lb 3.2 oz (74 kg)   SpO2 97%   BMI 24.81  kg/m  General:   Alert and oriented. No distress noted. Pleasant and cooperative.  Head:  Normocephalic and atraumatic. Eyes:  Conjuctiva clear without scleral icterus. Heart:  S1, S2 present without murmurs appreciated. Lungs:  Clear to auscultation bilaterally. No wheezes, rales, or rhonchi. No distress.  Abdomen:  +BS, soft, non-tender and non-distended. No rebound or guarding. No HSM or masses noted. Rectal: Hemorrhoids present, prior anal fissure healed. Msk:  Symmetrical without gross deformities. Normal posture. Extremities:  Without edema. Neurologic:  Alert and  oriented x4 Psych:  Normal mood and affect.    Assessment:  27 year old male with no  significant past medical history, presenting today for follow-up of rectal bleeding/rectal pain, anal fissure, hemorrhoids.   Rectal bleeding/rectal pain/anal fissure/hemorrhoids: Rectal pain resolved and rectal bleeding significantly improved, only with intermittent toilet tissue hematochezia in the setting of passing a hard stool.  Hemoglobin in March at time of consult was normal. Rectal exam today with no appreciable anal fissure.  Suspect residual scant toilet tissue hematochezia is likely secondary to hemorrhoids, but I have recommended a colonoscopy to rule out other causes.  If colonoscopy is unrevealing, we can consider hemorrhoid banding.  Also advised to start daily stool softener and use Preparation H as needed.   Plan:  Proceed with colonoscopy with propofol by Dr. Jena Gauss in near future. The risks, benefits, and alternatives have been discussed with the patient in detail. The patient states understanding and desires to proceed.  ASA 2 Start Colace 100 mg daily.  May increase to 200 mg daily if needed.  Requested patient to let me know if he continues with intermittent hard stools. Preparation H twice daily as needed. Limit toilet time to 2-3 minutes. Avoid straining. Follow-up after colonoscopy.   Ermalinda Memos, PA-C [facility] 08/14/2022

## 2022-08-14 ENCOUNTER — Encounter: Payer: Self-pay | Admitting: Gastroenterology

## 2022-08-14 ENCOUNTER — Ambulatory Visit: Payer: BC Managed Care – PPO | Admitting: Gastroenterology

## 2022-08-14 ENCOUNTER — Telehealth: Payer: Self-pay | Admitting: *Deleted

## 2022-08-14 VITALS — BP 113/77 | HR 77 | Temp 97.7°F | Ht 68.0 in | Wt 163.2 lb

## 2022-08-14 DIAGNOSIS — K602 Anal fissure, unspecified: Secondary | ICD-10-CM

## 2022-08-14 DIAGNOSIS — K649 Unspecified hemorrhoids: Secondary | ICD-10-CM

## 2022-08-14 DIAGNOSIS — K625 Hemorrhage of anus and rectum: Secondary | ICD-10-CM | POA: Diagnosis not present

## 2022-08-14 NOTE — Telephone Encounter (Signed)
LMOVM to call back to schedule TCS with Dr. Rourk, ASA 2 

## 2022-08-14 NOTE — Patient Instructions (Signed)
Your anal fissure has healed.  I suspect your ongoing rectal bleeding is secondary to hemorrhoids, but I do recommend that we proceed with a colonoscopy with Dr. Jena Gauss to be sure.  Start Colace (docusate sodium) 100 mg daily to help soften your stools up.  If you continue to have intermittent hard stools, increase Colace to 100 mg twice daily or 200 mg once daily.  If you continue to have hard stools or after, please let me know.  You may use Preparation H twice daily as needed for rectal bleeding related to hemorrhoids.  Limit toilet time to 2-3 minutes.  Avoid straining.  We will follow-up with you after your colonoscopy.  Do not hesitate to call sooner if you have questions or concerns.  It was great to see you again today!   Ermalinda Memos, PA-C [facility]

## 2022-08-22 NOTE — Telephone Encounter (Signed)
LMTCB. Letter mailed ?

## 2022-09-14 ENCOUNTER — Encounter: Payer: Self-pay | Admitting: *Deleted

## 2022-09-14 NOTE — Telephone Encounter (Signed)
Pt has been scheduled for 10/02/22. Instructions and prep placed up front for pt to pick up.

## 2022-10-02 ENCOUNTER — Ambulatory Visit (HOSPITAL_COMMUNITY)
Admission: RE | Admit: 2022-10-02 | Discharge: 2022-10-02 | Disposition: A | Discharge status: Home / Self Care | Payer: BC Managed Care – PPO | Attending: Internal Medicine | Admitting: Internal Medicine

## 2022-10-02 ENCOUNTER — Encounter (HOSPITAL_COMMUNITY): Payer: Self-pay | Admitting: Internal Medicine

## 2022-10-02 ENCOUNTER — Ambulatory Visit (HOSPITAL_COMMUNITY): Payer: BC Managed Care – PPO | Admitting: Anesthesiology

## 2022-10-02 ENCOUNTER — Encounter (HOSPITAL_COMMUNITY)
Admission: RE | Disposition: A | Discharge status: Home / Self Care | Payer: BC Managed Care – PPO | Source: Home / Self Care | Attending: Internal Medicine

## 2022-10-02 ENCOUNTER — Other Ambulatory Visit: Payer: Self-pay

## 2022-10-02 DIAGNOSIS — K921 Melena: Secondary | ICD-10-CM | POA: Diagnosis not present

## 2022-10-02 DIAGNOSIS — K648 Other hemorrhoids: Secondary | ICD-10-CM

## 2022-10-02 DIAGNOSIS — K641 Second degree hemorrhoids: Secondary | ICD-10-CM | POA: Insufficient documentation

## 2022-10-02 DIAGNOSIS — Z83719 Family history of colon polyps, unspecified: Secondary | ICD-10-CM | POA: Diagnosis not present

## 2022-10-02 DIAGNOSIS — K625 Hemorrhage of anus and rectum: Secondary | ICD-10-CM

## 2022-10-02 HISTORY — PX: COLONOSCOPY WITH PROPOFOL: SHX5780

## 2022-10-02 SURGERY — COLONOSCOPY WITH PROPOFOL
Anesthesia: General

## 2022-10-02 MED ORDER — PROPOFOL 10 MG/ML IV BOLUS
INTRAVENOUS | Status: DC | PRN
  Administered 2022-10-02 (×2): 100 mg via INTRAVENOUS

## 2022-10-02 MED ORDER — LACTATED RINGERS IV SOLN
INTRAVENOUS | Status: DC
Start: 2022-10-02 — End: 2022-10-02
  Administered 2022-10-02: 1000 mL via INTRAVENOUS

## 2022-10-02 MED ORDER — DEXMEDETOMIDINE HCL IN NACL 80 MCG/20ML IV SOLN
INTRAVENOUS | Status: AC
  Filled 2022-10-02: qty 20

## 2022-10-02 MED ORDER — DEXMEDETOMIDINE HCL IN NACL 80 MCG/20ML IV SOLN
INTRAVENOUS | Status: DC | PRN
  Administered 2022-10-02: 8 ug via INTRAVENOUS
  Administered 2022-10-02: 12 ug via INTRAVENOUS

## 2022-10-02 MED ORDER — LIDOCAINE HCL (CARDIAC) PF 100 MG/5ML IV SOSY
PREFILLED_SYRINGE | INTRAVENOUS | Status: DC | PRN
  Administered 2022-10-02: 50 mg via INTRAVENOUS

## 2022-10-02 MED ORDER — STERILE WATER FOR IRRIGATION IR SOLN
Status: DC | PRN
  Administered 2022-10-02: .6 mL

## 2022-10-02 MED ORDER — PROPOFOL 500 MG/50ML IV EMUL
INTRAVENOUS | Status: DC | PRN
  Administered 2022-10-02: 250 ug/kg/min via INTRAVENOUS

## 2022-10-02 NOTE — Transfer of Care (Signed)
Immediate Anesthesia Transfer of Care Note  Patient: Jonathan Sanders  Procedure(s) Performed: COLONOSCOPY WITH PROPOFOL  Patient Location: Endoscopy Unit  Anesthesia Type:General  Level of Consciousness: drowsy  Airway & Oxygen Therapy: Patient Spontanous Breathing  Post-op Assessment: Report given to RN and Post -op Vital signs reviewed and stable  Post vital signs: Reviewed and stable  Last Vitals:  Vitals Value Taken Time  BP    Temp    Pulse    Resp    SpO2      Last Pain:  Vitals:   10/02/22 1044  TempSrc: Oral  PainSc: 0-No pain      Patients Stated Pain Goal: 10 (10/02/22 1044)  Complications: No notable events documented.

## 2022-10-02 NOTE — Anesthesia Preprocedure Evaluation (Addendum)
Anesthesia Evaluation  Patient identified by MRN, date of birth, ID band Patient awake    Reviewed: Allergy & Precautions, H&P , NPO status , Patient's Chart, lab work & pertinent test results  Airway Mallampati: II  TM Distance: >3 FB Neck ROM: Full    Dental no notable dental hx. (+) Chipped, Dental Advisory Given,    Pulmonary neg pulmonary ROS   Pulmonary exam normal breath sounds clear to auscultation       Cardiovascular negative cardio ROS Normal cardiovascular exam Rhythm:Regular Rate:Normal     Neuro/Psych negative neurological ROS  negative psych ROS   GI/Hepatic negative GI ROS, Neg liver ROS,,,  Endo/Other  negative endocrine ROS    Renal/GU negative Renal ROS  negative genitourinary   Musculoskeletal OSGOOD SCHLATTER'S DISEASE   Abdominal   Peds negative pediatric ROS (+)  Hematology negative hematology ROS (+)   Anesthesia Other Findings Idiopathic hypersomnia with long sleep time  Reproductive/Obstetrics negative OB ROS                             Anesthesia Physical Anesthesia Plan  ASA: 2  Anesthesia Plan: General   Post-op Pain Management: Minimal or no pain anticipated   Induction: Intravenous  PONV Risk Score and Plan: 1 and Propofol infusion  Airway Management Planned: Nasal Cannula and Natural Airway  Additional Equipment:   Intra-op Plan:   Post-operative Plan:   Informed Consent: I have reviewed the patients History and Physical, chart, labs and discussed the procedure including the risks, benefits and alternatives for the proposed anesthesia with the patient or authorized representative who has indicated his/her understanding and acceptance.     Dental advisory given  Plan Discussed with: CRNA and Surgeon  Anesthesia Plan Comments:        Anesthesia Quick Evaluation

## 2022-10-02 NOTE — Discharge Instructions (Addendum)
  Colonoscopy Discharge Instructions  Read the instructions outlined below and refer to this sheet in the next few weeks. These discharge instructions provide you with general information on caring for yourself after you leave the hospital. Your doctor may also give you specific instructions. While your treatment has been planned according to the most current medical practices available, unavoidable complications occasionally occur. If you have any problems or questions after discharge, call Dr. Jena Gauss at [phone]. ACTIVITY You may resume your regular activity, but move at a slower pace for the next 24 hours.  Take frequent rest periods for the next 24 hours.  Walking will help get rid of the air and reduce the bloated feeling in your belly (abdomen).  No driving for 24 hours (because of the medicine (anesthesia) used during the test).   Do not sign any important legal documents or operate any machinery for 24 hours (because of the anesthesia used during the test).  NUTRITION Drink plenty of fluids.  You may resume your normal diet as instructed by your doctor.  Begin with a light meal and progress to your normal diet. Heavy or fried foods are harder to digest and may make you feel sick to your stomach (nauseated).  Avoid alcoholic beverages for 24 hours or as instructed.  MEDICATIONS You may resume your normal medications unless your doctor tells you otherwise.  WHAT YOU CAN EXPECT TODAY Some feelings of bloating in the abdomen.  Passage of more gas than usual.  Spotting of blood in your stool or on the toilet paper.  IF YOU HAD POLYPS REMOVED DURING THE COLONOSCOPY: No aspirin products for 7 days or as instructed.  No alcohol for 7 days or as instructed.  Eat a soft diet for the next 24 hours.  FINDING OUT THE RESULTS OF YOUR TEST Not all test results are available during your visit. If your test results are not back during the visit, make an appointment with your caregiver to find out the  results. Do not assume everything is normal if you have not heard from your caregiver or the medical facility. It is important for you to follow up on all of your test results.  SEEK IMMEDIATE MEDICAL ATTENTION IF: You have more than a spotting of blood in your stool.  Your belly is swollen (abdominal distention).  You are nauseated or vomiting.  You have a temperature over 101.  You have abdominal pain or discomfort that is severe or gets worse throughout the day.    You have internal hemorrhoids.;  The remainder of your rectum and colon appeared normal  Recommend  you return for a screening colonoscopy at age 21  Hemorrhoid information provided.  Hemorrhoid banding pamphlet provided  Avoid straining.  Continue Colace as a stool softener  You may benefit from hemorrhoid banding when you return for follow-up.  Office visit with Lewie Loron in 2 months - office will call with appointment  At patient request, I called Rosita Kea at [place] findings and recommendations

## 2022-10-02 NOTE — Anesthesia Postprocedure Evaluation (Signed)
Anesthesia Post Note  Patient: Jonathan Sanders  Procedure(s) Performed: COLONOSCOPY WITH PROPOFOL  Patient location during evaluation: Phase II Anesthesia Type: General Level of consciousness: awake and alert and oriented Pain management: pain level controlled Vital Signs Assessment: post-procedure vital signs reviewed and stable Respiratory status: spontaneous breathing, nonlabored ventilation and respiratory function stable Cardiovascular status: blood pressure returned to baseline and stable Postop Assessment: no apparent nausea or vomiting Anesthetic complications: no  No notable events documented.   Last Vitals:  Vitals:   10/02/22 1157 10/02/22 1205  BP: (!) 94/50 96/79  Pulse: 81 79  Resp: 20 18  Temp:    SpO2: 97% 96%    Last Pain:  Vitals:   10/02/22 1205  TempSrc:   PainSc: 0-No pain                 Jamani Eley C Amitai Delaughter

## 2022-10-02 NOTE — Anesthesia Procedure Notes (Signed)
Date/Time: 10/02/2022 11:34 AM  Performed by: Julian Reil, CRNAPre-anesthesia Checklist: Patient identified, Emergency Drugs available, Suction available and Patient being monitored Patient Re-evaluated:Patient Re-evaluated prior to induction Oxygen Delivery Method: Nasal cannula Induction Type: IV induction Placement Confirmation: positive ETCO2

## 2022-10-02 NOTE — H&P (Signed)
@LOGO @   Primary Care Physician:  Nelwyn Salisbury, MD Primary Gastroenterologist:  Dr. Jena Gauss  Pre-Procedure History & Physical: HPI:  Jonathan Sanders is a 27 y.o. male here for  further evaluation of rectal bleeding.  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  Symptoms relieved with nitroglycerin containing hemorrhoid cream.  Mother with a history of polyps in her 58s.  Patient continues to have intermittent paper hematochezia.  Past Medical History:  Diagnosis Date   Chicken pox     History reviewed. No pertinent surgical history.  Prior to Admission medications   Medication Sig Start Date End Date Taking? Authorizing Provider  cetirizine (ZYRTEC) 10 MG tablet Take 10 mg by mouth at bedtime as needed for allergies.   Yes [provider]  hydrocortisone (ANUSOL-HC) 2.5 % rectal cream Place 1 Application rectally 4 (four) times daily. [facility] Hemorrhoid Cream Compounded With 0.125% Nitroglycerine. Patient not taking: Reported on 08/14/2022 07/14/22   Letta Median, PA-C    Allergies as of 09/14/2022   (No Known Allergies)    Family History  Problem Relation Age of Onset   Colon polyps Mother        61s   Hyperlipidemia Other    Diabetes Other    Hypertension Other    Colon cancer Neg Hx    Inflammatory bowel disease Neg Hx     Social History   Socioeconomic History   Marital status: Married    Spouse name: Not on file   Number of children: Not on file   Years of education: Not on file   Highest education level: Not on file  Occupational History   Not on file  Tobacco Use   Smoking status: Never   Smokeless tobacco: Never  Substance and Sexual Activity   Alcohol use: Yes    Alcohol/week: 0.0 standard drinks of alcohol    Comment: occ   Drug use: No   Sexual activity: Not on file  Other Topics Concern   Not on file  Social History Narrative   Not on file   Social Determinants of Health   Financial Resource Strain:  Not on file  Food Insecurity: Not on file  Transportation Needs: Not on file  Physical Activity: Not on file  Stress: Not on file  Social Connections: Not on file  Intimate Partner Violence: Not on file    Review of Systems: See HPI, otherwise negative ROS  Physical Exam: BP 124/82   Pulse 77   Temp 98.3 F (36.8 C) (Oral)   Resp 20   Ht 5\' 8"  (1.727 m)   Wt 70.8 kg   SpO2 100%   BMI 23.72 kg/m  General:   Alert,  Well-developed, well-nourished, pleasant and cooperative in NAD Mouth:  No deformity or lesions. Neck:  Supple; no masses or thyromegaly. No significant cervical adenopathy. Lungs:  Clear throughout to auscultation.   No wheezes, crackles, or rhonchi. No acute distress. Heart:  Regular rate and rhythm; no murmurs, clicks, rubs,  or gallops. Abdomen: Non-distended, normal bowel sounds.  Soft and nontender without appreciable mass or hepatosplenomegaly.   Impression/Plan:    27 year old gentleman here for further evaluation of paper hematochezia.  Rectal pain has subsided with a course of nitroglycerin containing hemorrhoid cream.    I have offered the patient a diagnostic colonoscopy  The risks, benefits, limitations, alternatives and imponderables have been reviewed with the patient. Questions have been answered. All parties are agreeable.  Notice: This dictation was prepared with Dragon dictation along with smaller phrase technology. Any transcriptional errors that result from this process are unintentional and may not be corrected upon review.

## 2022-10-02 NOTE — Op Note (Signed)
[facility] Patient Name: Jonathan Sanders Procedure Date: 10/02/2022 11:09 AM MRN: [id number] Date of Birth: 11-18-1995 Attending MD: Gennette Pac , MD, [id number] CSN: [id number] Age: 27 Admit Type: Outpatient Procedure:                Colonoscopy Indications:              Hematochezia Providers:                Gennette Pac, MD Referring MD:              Medicines:                Propofol per Anesthesia Complications:            No immediate complications. Estimated Blood Loss:     Estimated blood loss: none. Procedure:                Pre-Anesthesia Assessment:                           - Prior to the procedure, a History and Physical                            was performed, and patient medications and                            allergies were reviewed. The patient's tolerance of                            previous anesthesia was also reviewed. The risks                            and benefits of the procedure and the sedation                            options and risks were discussed with the patient.                            All questions were answered, and informed consent                            was obtained. Prior Anticoagulants: The patient has                            taken no anticoagulant or antiplatelet agents. ASA                            Grade Assessment: II - A patient with mild systemic                            disease. After reviewing the risks and benefits,                            the patient was deemed in satisfactory condition to  undergo the procedure.                           After obtaining informed consent, the colonoscope                            was passed under direct vision. Throughout the                            procedure, the patient's blood pressure, pulse, and                            oxygen saturations were monitored continuously. The                            [phone]  scope was introduced through                            the anus and advanced to the the cecum, identified                            by appendiceal orifice and ileocecal valve. The                            colonoscopy was performed without difficulty. The                            patient tolerated the procedure well. The quality                            of the bowel preparation was adequate. The                            colonoscopy was performed with ease. The entire                            colon was well visualized. Scope In: 11:27:22 AM Scope Out: 11:40:53 AM Scope Withdrawal Time: 0 hours 6 minutes 11 seconds  Total Procedure Duration: 0 hours 13 minutes 31 seconds  Findings:      Hemorrhoids were found on perianal exam.      Non-bleeding internal hemorrhoids were found during retroflexion. The       hemorrhoids were moderate, medium-sized and Grade II (internal       hemorrhoids that prolapse but reduce spontaneously).      The entire examined colon appeared normal. Impression:               - Hemorrhoids found on perianal exam.                           - Non-bleeding internal hemorrhoids.                           - The entire examined colon is normal.                           -  Clinically, bleeding has improved but not                            resolved. If symptoms persist, he would be a good                            hemorrhoid banding candidate Moderate Sedation:      Moderate (conscious) sedation was personally administered by an       anesthesia professional. The following parameters were monitored: oxygen       saturation, heart rate, blood pressure, respiratory rate, EKG, adequacy       of pulmonary ventilation, and response to care. Recommendation:           - Patient has a contact number available for                            emergencies. The signs and symptoms of potential                            delayed complications were discussed with the                             patient. Return to normal activities tomorrow.                            Written discharge instructions were provided to the                            patient.                           - Advance diet as tolerated.                           - Continue present medications.                           - Repeat colonoscopy age 89 for screening purposes.                           -Hemorrhoid treatment information provided                            hemorrhoid banding pamphlet provided. Office visit                            in 8 weeks with Lewie Loron. Procedure Code(s):        --- Professional ---                           [phone], Colonoscopy, flexible; diagnostic, including                            collection of specimen(s) by brushing or washing,  when performed (separate procedure) Diagnosis Code(s):        --- Professional ---                           K64.1, Second degree hemorrhoids                           K92.1, Melena (includes Hematochezia) CPT copyright 2022 [employer]. All rights reserved. The codes documented in this report are preliminary and upon coder review may  be revised to meet current compliance requirements. Jonathan Sanders. Jonathan Chumney, MD Gennette Pac, MD 10/02/2022 11:50:48 AM This report has been signed electronically. Number of Addenda: 0

## 2022-10-09 ENCOUNTER — Encounter (HOSPITAL_COMMUNITY): Payer: Self-pay | Admitting: Internal Medicine

## 2022-12-05 ENCOUNTER — Ambulatory Visit: Payer: BC Managed Care – PPO | Admitting: Gastroenterology

## 2022-12-12 ENCOUNTER — Encounter: Payer: Self-pay | Admitting: Gastroenterology

## 2022-12-12 ENCOUNTER — Ambulatory Visit: Payer: BC Managed Care – PPO | Admitting: Gastroenterology

## 2022-12-12 VITALS — BP 121/69 | HR 79 | Temp 98.6°F | Ht 68.0 in | Wt 158.6 lb

## 2022-12-12 DIAGNOSIS — K648 Other hemorrhoids: Secondary | ICD-10-CM

## 2022-12-12 NOTE — Progress Notes (Signed)
      [facility] BANDING PROCEDURE NOTE  Jonathan Sanders is a 27 y.o. male presenting today for consideration of hemorrhoid banding. Last colonoscopy June 2024 with non-bleeding internal hemorrhoids. Rectal bleeding for 6-7 years. Treated for fissure in past. This has resolved. Hard stool a few times after the colonoscopy and some bleeding. No bleeding now. Stools soft. Bristol scale #4. Has blood when wiping now. Not soaking thru toilet paper anymore. Stool softeners, eating more fiber with meals. Very much wants to have hemorrhoid banding, noting prolapsing, worsening itching, and chronic bleeding.   The patient presents with symptomatic grade 3 hemorrhoids, unresponsive to maximal medical therapy, requesting rubber band ligation of his hemorrhoidal disease. All risks, benefits, and alternative forms of therapy were described and informed consent was obtained.  The decision was made to band the neutrally, and the [facility] O'Regan System was used to perform band ligation without complication. Appears to be right anterior.  Digital anorectal examination was then performed to assure proper positioning of the band, and to adjust the banded tissue as required. The patient was discharged home without pain or other issues. Dietary and behavioral recommendations were given, along with follow-up instructions. The patient will return in several weeks for followup and possible additional banding as required.  No complications were encountered and the patient tolerated the procedure well.   Gelene Mink, PhD, ANP-BC [facility]

## 2022-12-12 NOTE — Patient Instructions (Signed)
  Please avoid straining.  You should limit your toilet time to 2-3 minutes at the most.   I recommend Benefiber 2 teaspoons each morning in the beverage of your choice!  Please call me with any concerns or issues!  I will see you in follow-up for additional banding in several weeks.     It was a pleasure to see you today. I want to create trusting relationships with patients and provide genuine, compassionate, and quality care. I truly value your feedback, so please be on the lookout for a survey regarding your visit with me today. I appreciate your time in completing this!    Annitta Needs, PhD, ANP-BC [facility]

## 2023-01-11 ENCOUNTER — Encounter: Payer: BC Managed Care – PPO | Admitting: Gastroenterology

## 2023-02-01 ENCOUNTER — Ambulatory Visit: Payer: BC Managed Care – PPO | Admitting: Gastroenterology

## 2023-02-01 ENCOUNTER — Encounter: Payer: Self-pay | Admitting: Gastroenterology

## 2023-02-01 VITALS — BP 121/74 | HR 78 | Temp 97.9°F | Ht 68.0 in | Wt 159.2 lb

## 2023-02-01 DIAGNOSIS — K641 Second degree hemorrhoids: Secondary | ICD-10-CM

## 2023-02-01 DIAGNOSIS — K648 Other hemorrhoids: Secondary | ICD-10-CM

## 2023-02-01 NOTE — Progress Notes (Signed)
    [facility] BANDING PROCEDURE NOTE  Jonathan Sanders is a 27 y.o. male presenting today for consideration of hemorrhoid banding. Last colonoscopy June 2024 with non-bleeding internal hemorrhoids. Last banding right anterior. He had itching after the initial banding but now resolved. Still with prolapsing and rectal bleeding.    The patient presents with symptomatic grade 2 hemorrhoids, unresponsive to maximal medical therapy, requesting rubber band ligation of his hemorrhoidal disease. All risks, benefits, and alternative forms of therapy were described and informed consent was obtained.   The decision was made to band the left lateral internal hemorrhoid, and the [facility] O'Regan System was used to perform band ligation without complication. Digital anorectal examination was then performed to assure proper positioning of the band, and to adjust the banded tissue as required. The patient was discharged home without pain or other issues. Dietary and behavioral recommendations were given, along with follow-up instructions. The patient will return in several weeks for followup and possible additional banding as required.  No complications were encountered and the patient tolerated the procedure well.   Gelene Mink, PhD, ANP-BC [facility]

## 2023-02-01 NOTE — Patient Instructions (Signed)

## 2023-02-15 ENCOUNTER — Encounter: Payer: Self-pay | Admitting: Gastroenterology

## 2023-02-15 ENCOUNTER — Ambulatory Visit: Payer: BC Managed Care – PPO | Admitting: Gastroenterology

## 2023-02-15 VITALS — BP 111/69 | HR 72 | Temp 98.1°F | Ht 67.0 in | Wt 158.0 lb

## 2023-02-15 DIAGNOSIS — K648 Other hemorrhoids: Secondary | ICD-10-CM

## 2023-02-15 DIAGNOSIS — K641 Second degree hemorrhoids: Secondary | ICD-10-CM

## 2023-02-15 NOTE — Progress Notes (Signed)
    [facility] BANDING PROCEDURE NOTE  Jonathan Sanders is a 27 y.o. male presenting today for consideration of hemorrhoid banding. Last colonoscopy June 2024 with non-bleeding internal hemorrhoids. He has had left lateral and right anterior banding. He has had notable improvement in bleeding and itching.    The patient presents with symptomatic grade 2 hemorrhoids, unresponsive to maximal medical therapy, requesting rubber band ligation of his hemorrhoidal disease. All risks, benefits, and alternative forms of therapy were described and informed consent was obtained.   The decision was made to neutrally, and the [facility] O'Regan System was used to perform band ligation without complication. Digital anorectal examination was then performed to assure proper positioning of the band, and to adjust the banded tissue as required. The patient was discharged home without pain or other issues. Dietary and behavioral recommendations were given, along with follow-up instructions. The patient will return as needed.   No complications were encountered and the patient tolerated the procedure well.   Gelene Mink, PhD, ANP-BC [facility]

## 2023-12-14 DIAGNOSIS — R42 Dizziness and giddiness: Secondary | ICD-10-CM | POA: Diagnosis not present
